# Patient Record
Sex: Male | Born: 1990 | Race: White | Hispanic: No | Marital: Single | Smoking: Current every day smoker
Health system: Southern US, Community
[De-identification: ages and names within clinical notes are randomized; demographics above are authoritative.]

## PROBLEM LIST (undated history)

## (undated) DIAGNOSIS — K119 Disease of salivary gland, unspecified: Secondary | ICD-10-CM

## (undated) DIAGNOSIS — G43909 Migraine, unspecified, not intractable, without status migrainosus: Secondary | ICD-10-CM

## (undated) HISTORY — PX: THROAT SURGERY: SHX803

---
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
## 2012-01-13 ENCOUNTER — Encounter (HOSPITAL_COMMUNITY): Payer: Self-pay | Admitting: *Deleted

## 2012-01-13 ENCOUNTER — Emergency Department (HOSPITAL_COMMUNITY)
Admission: EM | Admit: 2012-01-13 | Discharge: 2012-01-13 | Disposition: A | Discharge status: Home / Self Care | Payer: BC Managed Care – PPO | Attending: Emergency Medicine | Admitting: Emergency Medicine

## 2012-01-13 DIAGNOSIS — R070 Pain in throat: Secondary | ICD-10-CM | POA: Insufficient documentation

## 2012-01-13 DIAGNOSIS — K1120 Sialoadenitis, unspecified: Secondary | ICD-10-CM

## 2012-01-13 DIAGNOSIS — F17200 Nicotine dependence, unspecified, uncomplicated: Secondary | ICD-10-CM | POA: Insufficient documentation

## 2012-01-13 DIAGNOSIS — M542 Cervicalgia: Secondary | ICD-10-CM | POA: Insufficient documentation

## 2012-01-13 HISTORY — DX: Migraine, unspecified, not intractable, without status migrainosus: G43.909

## 2012-01-13 MED ORDER — IBUPROFEN 800 MG PO TABS: 800.0000 mg | ORAL_TABLET | Freq: Three times a day (TID) | ORAL | Status: DC

## 2012-01-13 MED ORDER — CLINDAMYCIN HCL 300 MG PO CAPS: 300.0000 mg | ORAL_CAPSULE | Freq: Three times a day (TID) | ORAL | Status: AC

## 2012-01-13 MED ORDER — HYDROCODONE-ACETAMINOPHEN 5-325 MG PO TABS: 2.0000 | ORAL_TABLET | ORAL | Status: AC | PRN

## 2012-01-13 NOTE — ED Provider Notes (Signed)
History   This chart was scribed for Glynn Octave, MD by Charolett Bumpers . The patient was seen in room APA18/APA18.    CSN: [id number]  Arrival date & time 01/13/12  2140   First MD Initiated Contact with Patient 01/13/12 2153      Chief Complaint  Patient presents with  . Sore Throat    (Consider location/radiation/quality/duration/timing/severity/associated sxs/prior treatment) HPI Walter Benitez is a 21 y.o. male who presents to the Emergency Department complaining of constant, moderate anterior throat pain with associated swelling and difficulty swallowing for the past 3 hours. Patient states that he has 2 salvia glands that are swollen with associated pain. Patient states that he has a h/o similar symptoms. Patient states that his symptoms are aggravated with swallowing. Patient states that he was last here 2 months ago with left sided salviary gland swelling, given antiinflammatory with improvement. Patient denies any fevers or vomiting. Patient denies any prior medical or surgical hx. Nothing makes the symptoms better.    Past Medical History  Diagnosis Date  . Migraine     History reviewed. No pertinent past surgical history.  History reviewed. No pertinent family history.  History  Substance Use Topics  . Smoking status: Current Everyday Smoker    Types: Cigarettes  . Smokeless tobacco: Not on file  . Alcohol Use: No      Review of Systems  Constitutional: Negative for fever.  HENT: Positive for trouble swallowing.        Swollen salvia glands.   Respiratory: Negative for cough and shortness of breath.   Cardiovascular: Negative for chest pain.  Gastrointestinal: Negative for nausea and vomiting.  All other systems reviewed and are negative.    Allergies  Review of patient's allergies indicates no known allergies.  Home Medications   Current Outpatient Rx  Name Route Sig Dispense Refill  . IBUPROFEN 200 MG PO TABS Oral Take 400 mg by  mouth as needed.      BP 152/79  Pulse 67  Temp 98.4 F (36.9 C)  Resp 18  Ht 6\' 1"  (1.854 m)  Wt 175 lb (79.379 kg)  BMI 23.09 kg/m2  SpO2 100%  Physical Exam  Nursing note and vitals reviewed. Constitutional: He is oriented to person, place, and time. He appears well-developed and well-nourished. No distress.  HENT:  Head: Normocephalic and atraumatic.  Mouth/Throat: Posterior oropharyngeal erythema present.       Mild oropharyngeal erythema.   Eyes: EOM are normal. Pupils are equal, round, and reactive to light.  Neck: Normal range of motion. Neck supple. No tracheal deviation present.       Palpable cervical anterior chain adenopathy.   Cardiovascular: Normal rate.   Pulmonary/Chest: Effort normal. No respiratory distress.  Abdominal: Soft. He exhibits no distension.  Musculoskeletal: Normal range of motion. He exhibits no edema.  Lymphadenopathy:    He has cervical adenopathy.  Neurological: He is alert and oriented to person, place, and time. No sensory deficit.  Skin: Skin is warm and dry.  Psychiatric: He has a normal mood and affect. His behavior is normal.    ED Course  Procedures (including critical care time)  DIAGNOSTIC STUDIES: Oxygen Saturation is 100% on room air, normal by my interpretation.    COORDINATION OF CARE:  2209: Discussed planned course of treatment with the patient, who is agreeable at this time.   Results for orders placed during the hospital encounter of 01/13/12  RAPID STREP SCREEN  Component Value Range   Streptococcus, Group A Screen (Direct) NEGATIVE  NEGATIVE     No results found.   No diagnosis found.    MDM  3 hours of anterior throat and neck pain. History of inflamed salivary glands is similar. No drooling, trismus, fevers, vomiting. No cough, chest pain, SOB.  Possible salivary gland stone or infection. No dental problem. No oropharyngeal swelling.  Provide antibiotics, ibuprofen, pain medication, followup with  ENT. Patient instructed to suck on sour candies as needed.  I personally performed the services described in this documentation, which was scribed in my presence.  The recorded information has been reviewed and considered.       Glynn Octave, MD 01/13/12 [phone]

## 2012-01-13 NOTE — Discharge Instructions (Signed)
Salivary Gland Infection Take the antibiotics and pain medicines as prescribed.  Follow up with ENT in [place]. Return to the ED if you develop new or worsening symptoms. A salivary gland infection can be caused by a virus, bacteria from the mouth, or a stone. Mumps and other viruses may settle in one or more of the saliva glands. This will result in swelling, pain, and difficulty eating. Bacteria may cause a more severe infection in a salivary gland. A salivary stone blocking the flow of saliva can make this worse. These infections may be related to other medical problems. Some of these are dehydration, recent surgery, poor nutrition, and some medications. TREATMENT  Treatment of a salivary gland infection depends on the cause. Mumps and other virus infections do not require antibiotics. If bacteria cause the infection, then antibiotics are needed to get rid of the infection. If there is a salivary stone blocking the duct, minor surgery to remove the stone may be needed.  HOME CARE INSTRUCTIONS   Get plenty of rest, increase your fluids, and use warm compresses on the swollen area for 15 to 20 minutes 4 times per day or as often as feels good to you.   Suck on hard candy or chew sugarless gum to promote saliva production.   Only take over-the-counter or prescription medicines for pain, discomfort, or fever as directed by your caregiver.  SEEK IMMEDIATE MEDICAL CARE IF:   You have increased swelling or pain or pain not relieved with medications.   You develop chills or a fever.   Any of your problems are getting worse rather than better.  Document Released: 09/01/2004 Document Revised: 07/14/2011 Document Reviewed: 07/25/2005 [facility] Patient Information 2012 [place], [place].

## 2012-01-13 NOTE — ED Notes (Signed)
Pt alert & oriented x4, stable gait. Pt given discharge instructions, paperwork & prescription(s). Patient instructed to stop at the registration desk to finish any additional paperwork. pt verbalized understanding. Pt left department w/ no further questions.  

## 2012-01-13 NOTE — ED Notes (Signed)
Pt reports in ability to swallow due to pain.  Repots history of inflamed salivary gland and states he presently feels that they are swollen again bilaterally.

## 2012-01-13 NOTE — ED Notes (Signed)
Pt reports problems w/ salvary gland in the past. States pain started about 2 hours ago. States has 2 knots under his jaw.

## 2012-01-22 ENCOUNTER — Encounter (HOSPITAL_COMMUNITY): Payer: Self-pay | Admitting: *Deleted

## 2012-01-22 ENCOUNTER — Emergency Department (HOSPITAL_COMMUNITY)
Admission: EM | Admit: 2012-01-22 | Discharge: 2012-01-22 | Disposition: A | Discharge status: Home / Self Care | Payer: BC Managed Care – PPO | Attending: Emergency Medicine | Admitting: Emergency Medicine

## 2012-01-22 DIAGNOSIS — K115 Sialolithiasis: Secondary | ICD-10-CM

## 2012-01-22 DIAGNOSIS — K1120 Sialoadenitis, unspecified: Secondary | ICD-10-CM

## 2012-01-22 DIAGNOSIS — F17200 Nicotine dependence, unspecified, uncomplicated: Secondary | ICD-10-CM | POA: Insufficient documentation

## 2012-01-22 DIAGNOSIS — R220 Localized swelling, mass and lump, head: Secondary | ICD-10-CM | POA: Insufficient documentation

## 2012-01-22 MED ORDER — NAPROXEN 250 MG PO TABS: 250.0000 mg | ORAL_TABLET | Freq: Two times a day (BID) | ORAL | Status: DC

## 2012-01-22 MED ORDER — AMOXICILLIN-POT CLAVULANATE 875-125 MG PO TABS: 1.0000 | ORAL_TABLET | Freq: Two times a day (BID) | ORAL | Status: DC

## 2012-01-22 MED ORDER — HYDROCODONE-ACETAMINOPHEN 5-325 MG PO TABS: ORAL_TABLET | ORAL | Status: DC

## 2012-01-22 NOTE — ED Notes (Signed)
Patient with no complaints at this time. Respirations even and unlabored. Skin warm/dry. Discharge instructions reviewed with patient at this time. Patient given opportunity to voice concerns/ask questions. Patient discharged at this time and left Emergency Department with steady gait.   

## 2012-01-22 NOTE — ED Notes (Signed)
Pt seen in er with swollen salivary glands recently, states that he has not gotten any better and that it is hard for him to swallow, eat or drink.

## 2012-01-22 NOTE — ED Provider Notes (Signed)
History  This chart was scribed for Laray Anger, DO by Bennett Scrape. This patient was seen in room APA12/APA12 and the patient's care was started at 11:10AM.  CSN: [id number]  Arrival date & time 01/22/12  1047   First MD Initiated Contact with Patient 01/22/12 1110      Chief Complaint  Patient presents with  . Facial Swelling    The history is provided by the patient. No language interpreter was used.    Walter Benitez is a 21 y.o. male who presents to the Emergency Department complaining of gradual onset and persistence of multiple intermittent episodes of "knots in my glands" over the past several years, worse over the past several days.  States the "knots" are located under his chin and are "sore" when palpated.  Pain worsens with swallowing.  Pt has been eval in the ED twice previously for same, has not f/u with ENT as instructed.  Denies throat pain, no intra-oral edema, no fevers, no dental pain/injury, no facial or neck swelling, no SOB/cough, no rash, no N/V.    No Current PCP.  Past Medical History  Diagnosis Date  . Migraine     History reviewed. No pertinent past surgical history.   History  Substance Use Topics  . Smoking status: Current Everyday Smoker    Types: Cigarettes  . Smokeless tobacco: Not on file  . Alcohol Use: No    Review of Systems ROS: Statement: All systems negative except as marked or noted in the HPI; Constitutional: Negative for fever and chills. ; ; Eyes: Negative for eye pain, redness and discharge. ; ; ENMT: +mandibular salivary glands pain.  Negative for ear pain, hoarseness, nasal congestion, sinus pressure and sore throat. ; ; Cardiovascular: Negative for chest pain, palpitations, diaphoresis, dyspnea and peripheral edema. ; ; Respiratory: Negative for cough, wheezing and stridor. ; ; Gastrointestinal: Negative for nausea, vomiting, diarrhea, abdominal pain, blood in stool, hematemesis, jaundice and rectal bleeding. . ; ;  Genitourinary: Negative for dysuria, flank pain and hematuria. ; ; Musculoskeletal: Negative for back pain and neck pain. Negative for swelling and trauma.; ; Skin: Negative for pruritus, rash, abrasions, blisters, bruising and skin lesion.; ; Neuro: Negative for headache, lightheadedness and neck stiffness. Negative for weakness, altered level of consciousness , altered mental status, extremity weakness, paresthesias, involuntary movement, seizure and syncope.     Allergies  Review of patient's allergies indicates no known allergies.  Home Medications   Current Outpatient Rx  Name Route Sig Dispense Refill  . CLINDAMYCIN HCL 300 MG PO CAPS Oral Take 1 capsule (300 mg total) by mouth 3 (three) times daily. 30 capsule 0  . HYDROCODONE-ACETAMINOPHEN 5-325 MG PO TABS Oral Take 2 tablets by mouth every 4 (four) hours as needed for pain. 10 tablet 0  . IBUPROFEN 200 MG PO TABS Oral Take 400 mg by mouth as needed.    . IBUPROFEN 800 MG PO TABS Oral Take 1 tablet (800 mg total) by mouth 3 (three) times daily. 21 tablet 0    Triage Vitals: BP 127/68  Pulse 76  Temp 97.4 F (36.3 C)  Resp 20  Ht 6\' 1"  (1.854 m)  Wt 175 lb (79.379 kg)  BMI 23.09 kg/m2  SpO2 99%  Physical Exam 1115: Physical examination:  Nursing notes reviewed; Vital signs and O2 SAT reviewed;  Constitutional: Well developed, Well nourished, Well hydrated, In no acute distress; Head:  Normocephalic, atraumatic; Eyes: EOMI, PERRL, No scleral icterus; ENMT: Mouth and pharynx  normal, no hoarse voice, no drooling, no stridor, no trismus. Mucous membranes moist; Neck: Supple, Full range of motion, +few palpable small nodules in submandibular area, no submandibular or neck edema or erythema, no parotid edema; Cardiovascular: Regular rate and rhythm, No murmur, rub, or gallop; Respiratory: Breath sounds clear & equal bilaterally, No rales, rhonchi, wheezes.  Speaking full sentences with ease, Normal respiratory effort/excursion; Chest:  Nontender, Movement normal; Abdomen: Soft, Nontender, Nondistended, Normal bowel sounds;; Extremities: Pulses normal, No tenderness, No edema, No calf edema or asymmetry.; Neuro: AA&Ox3, Major CN grossly intact.  Speech clear. No gross focal motor or sensory deficits in extremities.; Skin: Color normal, Warm, Dry.   ED Course  Procedures   MDM  MDM Reviewed: previous chart, nursing note and vitals     11:36 AM:  Pt was eval 2 months ago and 2 weeks ago for same complaint.  States he has had an additional 2 similar episodes in the past 4 years but has never followed up with PMD or ENT due to "losing the referrals."  Did not f/u in ED within 3 days as instructed during his last ED visit 2 weeks ago.  Pt states he filled and has been taking the antibiotic prescribed 2 weeks ago at [facility]; their pharmacy called by ED [facility], they confirm pt did NOT fill the abx rx but filled the narcotic rx.  Does not appear to be Ludwig's or abscess at this time.  Likely salivary stone.  Pt encouraged to take his abx and f/u with ENT this week.         I personally performed the services described in this documentation, which was scribed in my presence. The recorded information has been reviewed and considered. Tyisha Cressy Allison Quarry, DO 01/24/12 1149

## 2012-01-22 NOTE — Discharge Instructions (Signed)
RESOURCE GUIDE  Chronic Pain Problems: Contact [facility]  [phone] Patients need to be referred by their primary care doctor.  Insufficient Money for Medicine: Contact United Way:  call "211" or [facility] [phone].  No Primary Care Doctor: - Call [facility]  [phone] - can help you locate a primary care doctor that  accepts your insurance, provides certain services, etc. - Physician Referral Service- 1-800-533-3463  Agencies that provide inexpensive medical care: - [facility]  [phone] - [facility]  [phone] - [facility]  [phone] - Women's Clinic  [phone] - Planned Parenthood  [phone] - [facility]  [phone]  Medicaid-accepting [place] Providers: - [facility]- [address]  [phone], Mon-Fri 9am-7pm, Sat 9am-1pm - [facility]- [address], [address]  [phone] - [facility]- [address], [address]  [phone] - [facility]- [address] [place] [address]  [phone] - Veita Bland- [address], [phone]  Only accepts [place] Access Medicaid patients after they have their name  applied to their card  Self Pay (no insurance) in [place]: - Sickle Cell Patients: Dr Eric Dean, [facility]  [address], [phone] - [facility]- [address]  [phone]       -     [facility]- [address]       -     [facility]- see information above (Speak to Pam H if you do not have insurance)       -  [facility]- [address], [phone]       -  [facility] [place]- [address],  [phone]       -  [facility]- [address] [place] [address], [phone]       -  Dr Osei-Bonsu-  [address], [place], [phone]       -  [facility]- [address], [phone]       -  [facility]- [address] [place] [address], [phone], also [address], [phone]       -    [facility]- [address], [phone], 1st & 3rd Saturday   every month, 10am-1pm  1) Find a Doctor and Pay Out of Pocket Although you won't have to find out who is covered by your insurance plan, it is a good idea to ask around and get recommendations. You will then need to call the office and see if the doctor you have chosen will accept you as a new patient and what types of options they offer for patients who are self-pay. Some doctors offer discounts or will set up payment plans for their patients who do not have insurance, but you will need to ask so you aren't surprised when you get to your appointment.  2) Contact Your Local Health Department Not all health departments have doctors that can see patients for sick visits, but many do, so it is worth a call to see if yours does. If you don't know where your local health department is, you can check in your phone book. The CDC also has a tool to help you locate your state's health department, and many state websites also have   listings of all of their local health departments.  3) Find a Walk-in Clinic If your illness is not likely to be very severe or complicated, you may want to try a walk in clinic. These are popping up all over the country in pharmacies, drugstores, and shopping centers. They're usually staffed by nurse practitioners or physician assistants that have been trained to treat common illnesses and complaints. They're usually fairly quick and inexpensive. However, if you have serious medical issues or chronic medical problems, these are probably not your best option  STD Testing - [place] [facility] [place], [facility] Clinic, [address], [place], phone [phone] or 1-877-539-9860.  Monday - Friday, call for an appointment. - [place]  [facility] [place], [facility]D Clinic, [address], [place], phone [phone] or 1-877-539-9860.  Monday - Friday, call for an appointment.  Abuse/Neglect: - [place] Child Abuse Hotline (336) 641-3795 - [place] Child Abuse Hotline 800-378-5315 (After Hours)  Emergency Shelter:  [facility] (336) 271-5985  Maternity Homes: - [facility] (336) 275-9566 - [facility] (704) 372-4663  MRSA Hotline #:   832-7006  [place] Resources  [facility] [place]  [employer] [place] [facility] [address]                 [address]         [address]  [place]                                               [place]                              [place] Phone:  [phone]                                  Phone:  [phone]                   Phone:  [phone]  [place] [facility], [phone] - [place] Services - [facility]- 1-888-581-9988       -     [facility] in [place], [address],                                  336-349-4454, Insurance  [place] Child Abuse Hotline (336) 342-1394 or (336) 342-3537 (After Hours)   Behavioral Health Services  Substance Abuse Resources: - [facility]  336-882-2125 - [facility] 336-784-9470 - [facility] 336-285-9073 - [facility] 336-845-3988 - Residential & Outpatient Substance Abuse Program  800-659-3381  Psychological Services: -  Health  [phone] - [facility]  [phone] - [place] [facility], [address], [place], ACCESS LINE: 1-800-853-5163 or 336-641-4981, Http://www.[facility].com/services/adult.htm  Dental Assistance  If unable to pay or uninsured, contact:  [facility] or [place] [facility] to become qualified for the adult dental  clinic.  Patients with Medicaid:  [facility] [address], [phone], [phone]  If unable   to pay, or uninsured, contact [facility] [phone]) or [facility] [phone] in [place], [phone] in [facility]) to become qualified for the adult dental clinic  Other Low-Cost Community Dental Services: - [facility]- [address] [place], [place], [place], [address], [phone], [address], 2nd and 4th Thursday of the month at 6:30am.  10 clients each day by appointment, can sometimes see walk-in patients if someone does not show for an appointment. [facility]- [address] [place], [place], [address], [phone] - [facility]- [address] [place], [place], [address], [phone] [facility]- [phone] [facility] Department- [phone] [facility] [phone]     Take the prescriptions as directed.  Apply moist heat to the area(s) of discomfort, for 15 minutes at a time, several times per day for the next few days.  Do not fall asleep on a heating pack.  Call your regular medical doctor and the ENT doctor on Monday to schedule a follow up appointment this week.  Return to the Emergency Department immediately if worsening.

## 2012-02-01 ENCOUNTER — Emergency Department (HOSPITAL_COMMUNITY)
Admission: EM | Admit: 2012-02-01 | Discharge: 2012-02-01 | Disposition: A | Discharge status: Home / Self Care | Payer: BC Managed Care – PPO | Attending: Emergency Medicine | Admitting: Emergency Medicine

## 2012-02-01 ENCOUNTER — Encounter (HOSPITAL_COMMUNITY): Payer: Self-pay | Admitting: *Deleted

## 2012-02-01 DIAGNOSIS — K1120 Sialoadenitis, unspecified: Secondary | ICD-10-CM

## 2012-02-01 DIAGNOSIS — R599 Enlarged lymph nodes, unspecified: Secondary | ICD-10-CM | POA: Insufficient documentation

## 2012-02-01 DIAGNOSIS — F17200 Nicotine dependence, unspecified, uncomplicated: Secondary | ICD-10-CM | POA: Insufficient documentation

## 2012-02-01 MED ORDER — HYDROCODONE-ACETAMINOPHEN 5-325 MG PO TABS: ORAL_TABLET | ORAL | Status: AC

## 2012-02-01 MED ORDER — IBUPROFEN 800 MG PO TABS: 800.0000 mg | ORAL_TABLET | Freq: Three times a day (TID) | ORAL | Status: AC

## 2012-02-01 NOTE — ED Notes (Signed)
Pt was seen here for "swollen glands" in neck. Given antibiotic,  And has finished that, Now says it is worse. And cannot afford to go to ENT that he was referred to.  NAD

## 2012-02-01 NOTE — ED Provider Notes (Signed)
History     CSN: [id number]  Arrival date & time 02/01/12  1138   First MD Initiated Contact with Patient 02/01/12 1152      Chief Complaint  Patient presents with  . Lymphadenopathy    (Consider location/radiation/quality/duration/timing/severity/associated sxs/prior treatment) HPI Comments: Patient c/o recurrent sore throat pain for several weeks.  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  He has recently finished Augmentin and pain medications and states the symptoms had improved but began to have pain again three days ago similar to his previous episodes.  He states that he did not follow-up with ENT as recommended because he does not have the finances for the office visit.  He denies swelling of his throat or tongue, fever, vomiting or facial pain  Patient is a 21 y.o. male presenting with pharyngitis. The history is provided by the patient.  Sore Throat This is a recurrent problem. The current episode started 1 to 4 weeks ago. The problem occurs intermittently. The problem has been waxing and waning. Associated symptoms include a sore throat and swollen glands. Pertinent negatives include no abdominal pain, chills, congestion, coughing, fever, headaches, nausea, neck pain, rash, vomiting or weakness. The symptoms are aggravated by swallowing. He has tried NSAIDs for the symptoms. The treatment provided no relief.    Past Medical History  Diagnosis Date  . Migraine     History reviewed. No pertinent past surgical history.  History reviewed. No pertinent family history.  History  Substance Use Topics  . Smoking status: Current Everyday Smoker    Types: Cigarettes  . Smokeless tobacco: Not on file  . Alcohol Use: No      Review of Systems  Constitutional: Negative for fever, chills, activity change and appetite change.  HENT: Positive for sore throat. Negative for ear pain, congestion, facial swelling, trouble  swallowing and neck pain.   Respiratory: Negative for cough and stridor.   Gastrointestinal: Negative for nausea, vomiting and abdominal pain.  Skin: Negative for rash.  Neurological: Negative for weakness and headaches.  All other systems reviewed and are negative.    Allergies  Review of patient's allergies indicates no known allergies.  Home Medications   Current Outpatient Rx  Name Route Sig Dispense Refill  . IBUPROFEN 200 MG PO TABS Oral Take 400 mg by mouth as needed.    . AMOXICILLIN-POT CLAVULANATE 875-125 MG PO TABS Oral Take 1 tablet by mouth every 12 (twelve) hours.    Marland Kitchen HYDROCODONE-ACETAMINOPHEN 5-325 MG PO TABS  1 or 2 tabs PO q4-6 hours prn pain    . NAPROXEN 250 MG PO TABS Oral Take 250 mg by mouth 2 (two) times daily with a meal.      BP 141/83  Pulse 80  Temp 98.2 F (36.8 C) (Oral)  Resp 20  Ht 6' (1.829 m)  Wt 175 lb (79.379 kg)  BMI 23.73 kg/m2  SpO2 100%  Physical Exam  Nursing note and vitals reviewed. Constitutional: He is oriented to person, place, and time. He appears well-developed and well-nourished. No distress.  HENT:  Head: Normocephalic and atraumatic. No trismus in the jaw.  Right Ear: Tympanic membrane and ear canal normal.  Left Ear: Tympanic membrane and ear canal normal.  Mouth/Throat: Uvula is midline, oropharynx is clear and moist and mucous membranes are normal. No uvula swelling. No tonsillar abscesses.  Neck: Normal range of motion and phonation normal. Neck supple. No tracheal tenderness present.  Cardiovascular: Normal  rate, regular rhythm, normal heart sounds and intact distal pulses.   No murmur heard. Pulmonary/Chest: Effort normal and breath sounds normal. No stridor.  Musculoskeletal: Normal range of motion.  Lymphadenopathy:       Head (right side): No submental, no submandibular, no tonsillar and no preauricular adenopathy present.       Head (left side): No submental, no submandibular, no tonsillar and no preauricular  adenopathy present.    He has no cervical adenopathy.  Neurological: He is alert and oriented to person, place, and time.  Skin: Skin is warm and dry.    ED Course  Procedures (including critical care time)  Labs Reviewed - No data to display      MDM      Previous ER charts, nursing notes and vital signs were reviewed by me.  Patient has been treated here previously for same and recently finished  antibiotic therapy.  Has not followed up with ENT as advised.  Airway is patent. Afebrile. Vital signs are stable. He handles his secretions well.  Phonation is nml.  He is non-toxic appearing.  No palpable lymphadenopathy of the neck or head.    I have advised pt that he will need further management of sx's by either his PMD or ENT.  He verbalized understanding.    The patient appears reasonably screened and/or stabilized for discharge and I doubt any other medical condition or other [facility] requiring further screening, evaluation, or treatment in the ED at this time prior to discharge.   I have advised him that he will need further management with ENT.    Prescribed: Norco #15 ibuprofen  Alain Deschene L. Travell Desaulniers, [place] 02/03/12 1243

## 2012-02-04 NOTE — ED Provider Notes (Signed)
Medical screening examination/treatment/procedure(s) were performed by non-physician practitioner and as supervising physician I was immediately available for consultation/collaboration.   Joya Gaskins, MD 02/04/12 [phone]

## 2012-02-24 ENCOUNTER — Encounter (HOSPITAL_COMMUNITY): Payer: Self-pay

## 2012-02-24 ENCOUNTER — Emergency Department (HOSPITAL_COMMUNITY)
Admission: EM | Admit: 2012-02-24 | Discharge: 2012-02-24 | Disposition: A | Discharge status: Home / Self Care | Payer: BC Managed Care – PPO | Attending: Emergency Medicine | Admitting: Emergency Medicine

## 2012-02-24 DIAGNOSIS — R070 Pain in throat: Secondary | ICD-10-CM | POA: Insufficient documentation

## 2012-02-24 DIAGNOSIS — K1120 Sialoadenitis, unspecified: Secondary | ICD-10-CM

## 2012-02-24 DIAGNOSIS — F17200 Nicotine dependence, unspecified, uncomplicated: Secondary | ICD-10-CM | POA: Insufficient documentation

## 2012-02-24 MED ORDER — HYDROCODONE-ACETAMINOPHEN 5-325 MG PO TABS: 1.0000 | ORAL_TABLET | ORAL | Status: DC | PRN

## 2012-02-24 MED ORDER — HYDROCODONE-ACETAMINOPHEN 5-325 MG PO TABS: 1.0000 | ORAL_TABLET | ORAL | Status: AC | PRN

## 2012-02-24 MED ORDER — IBUPROFEN 600 MG PO TABS: 600.0000 mg | ORAL_TABLET | Freq: Four times a day (QID) | ORAL | Status: AC | PRN

## 2012-02-24 NOTE — ED Provider Notes (Signed)
History     CSN: [id number]  Arrival date & time 02/24/12  1019   First MD Initiated Contact with Patient 02/24/12 1028      Chief Complaint  Patient presents with  . Sore Throat    (Consider location/radiation/quality/duration/timing/severity/associated sxs/prior treatment) HPI Comments: Walter Benitez presents with pain and swelling in his submandibular area which has been intermittent symptoms for the past several months.  3 weeks ago he had an office procedure by Dr. Suszanne Conners remove several salivary stones but he continues to have pain and intermittent swelling.  He has not followed up with Dr. Suszanne Conners since his procedure but he has completed his pain medication and antibiotics.  He's been using an ice pack since yesterday evening and also took ibuprofen 800 mg last night which  did not significantly improve his symptoms.  He denies fevers or chills but does have increased pain with swallowing.  He denies shortness of breath or throat swelling.  He has not contacted Dr. Suszanne Conners yet today regarding his worst symptoms but plans to call him this afternoon.  The history is provided by the patient.    Past Medical History  Diagnosis Date  . Migraine     Past Surgical History  Procedure Date  . Throat surgery     No family history on file.  History  Substance Use Topics  . Smoking status: Current Everyday Smoker    Types: Cigarettes  . Smokeless tobacco: Not on file  . Alcohol Use: No      Review of Systems  Constitutional: Negative for fever.  HENT: Positive for sore throat and neck pain. Negative for congestion, trouble swallowing and voice change.   Eyes: Negative.   Respiratory: Negative for chest tightness and shortness of breath.   Cardiovascular: Negative for chest pain.  Gastrointestinal: Negative for nausea and abdominal pain.  Genitourinary: Negative.   Musculoskeletal: Negative for joint swelling and arthralgias.  Skin: Negative.  Negative for rash and wound.    Neurological: Negative for dizziness, weakness, light-headedness, numbness and headaches.  Hematological: Negative.   Psychiatric/Behavioral: Negative.     Allergies  Review of patient's allergies indicates no known allergies.  Home Medications   Current Outpatient Rx  Name Route Sig Dispense Refill  . HYDROCODONE-ACETAMINOPHEN 5-325 MG PO TABS Oral Take 1 tablet by mouth every 4 (four) hours as needed for pain. 20 tablet 0  . IBUPROFEN 200 MG PO TABS Oral Take 400 mg by mouth as needed.    . IBUPROFEN 600 MG PO TABS Oral Take 1 tablet (600 mg total) by mouth every 6 (six) hours as needed for pain. 30 tablet 0  . NAPROXEN 250 MG PO TABS Oral Take 250 mg by mouth 2 (two) times daily with a meal.      BP 135/88  Pulse 55  Temp 97.6 F (36.4 C) (Oral)  Resp 16  Ht 6\' 1"  (1.854 m)  Wt 180 lb (81.647 kg)  BMI 23.75 kg/m2  SpO2 100%  Physical Exam  Nursing note and vitals reviewed. Constitutional: He appears well-developed and well-nourished.  HENT:  Head: Normocephalic and atraumatic.  Right Ear: External ear normal.  Left Ear: External ear normal.  Nose: Nose normal.  Mouth/Throat: Uvula is midline, oropharynx is clear and moist and mucous membranes are normal.       Left submandibular lymph node tender and moderately indurated.  There is no expressible purulence in subungual space.  Floor of mouth is relatively soft except for the well  circumscribed  Nodule.  No erythema.  Eyes: Conjunctivae are normal.  Neck: Normal range of motion.  Cardiovascular: Normal rate, regular rhythm, normal heart sounds and intact distal pulses.   Pulmonary/Chest: Effort normal and breath sounds normal. He has no wheezes.  Abdominal: Soft. Bowel sounds are normal. There is no tenderness.  Musculoskeletal: Normal range of motion.  Neurological: He is alert.  Skin: Skin is warm and dry.  Psychiatric: He has a normal mood and affect.    ED Course  Procedures (including critical care  time)  Labs Reviewed - No data to display No results found.   No diagnosis found.    MDM  Ibuprofen 800 mg, hydrocodone 5/325 prescribed.  Ice packs were recommended.  Patient also encouraged to contact Dr. Suszanne Conners if he develops further problems or his symptoms are not improving with this treatment.  No signs of any salivary gland infection on today's exam.        Burgess Amor, PA 02/24/12 1213  Burgess Amor, [place] 02/24/12 1214

## 2012-02-24 NOTE — ED Notes (Signed)
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 States he had surgery 3 weeks ago to relieve the pressure in his glands, today the discomfort has returned. Swelling noted in glands bilaterally.

## 2012-02-24 NOTE — ED Notes (Signed)
Complain of pain in throat pain. Has surgery three weeks ago for same

## 2012-02-28 NOTE — ED Provider Notes (Signed)
Medical screening examination/treatment/procedure(s) were performed by non-physician practitioner and as supervising physician I was immediately available for consultation/collaboration.   Estefani Bateson W. Silvie Obremski, MD 02/28/12 2157 

## 2012-03-01 ENCOUNTER — Other Ambulatory Visit: Payer: Self-pay | Admitting: Otolaryngology

## 2012-03-01 DIAGNOSIS — K1120 Sialoadenitis, unspecified: Secondary | ICD-10-CM

## 2012-03-02 ENCOUNTER — Ambulatory Visit
Admission: RE | Admit: 2012-03-02 | Discharge: 2012-03-02 | Disposition: A | Discharge status: Home / Self Care | Payer: BC Managed Care – HMO | Source: Ambulatory Visit | Attending: Otolaryngology | Admitting: Otolaryngology

## 2012-03-02 DIAGNOSIS — K1120 Sialoadenitis, unspecified: Secondary | ICD-10-CM

## 2012-03-04 ENCOUNTER — Encounter (HOSPITAL_COMMUNITY): Payer: Self-pay | Admitting: *Deleted

## 2012-03-04 ENCOUNTER — Emergency Department (HOSPITAL_COMMUNITY)
Admission: EM | Admit: 2012-03-04 | Discharge: 2012-03-04 | Disposition: A | Discharge status: Home / Self Care | Payer: BC Managed Care – PPO | Attending: Emergency Medicine | Admitting: Emergency Medicine

## 2012-03-04 DIAGNOSIS — J029 Acute pharyngitis, unspecified: Secondary | ICD-10-CM

## 2012-03-04 DIAGNOSIS — F17200 Nicotine dependence, unspecified, uncomplicated: Secondary | ICD-10-CM | POA: Insufficient documentation

## 2012-03-04 DIAGNOSIS — R070 Pain in throat: Secondary | ICD-10-CM | POA: Insufficient documentation

## 2012-03-04 HISTORY — DX: Disease of salivary gland, unspecified: K11.9

## 2012-03-04 MED ORDER — HYDROCODONE-ACETAMINOPHEN 5-325 MG PO TABS: ORAL_TABLET | ORAL | Status: AC

## 2012-03-04 NOTE — ED Notes (Signed)
Pt c/o continued pain in salivary glands, states that he was seen by ENT in [place] and placed on steroids and antibiotics 7 days ago, pt states that he has not been able to sleep due to pain,  Pt states that his left side is better but his right side is not,

## 2012-03-04 NOTE — ED Provider Notes (Signed)
History     CSN: [id number]  Arrival date & time 03/04/12  1342   First MD Initiated Contact with Patient 03/04/12 1439      Chief Complaint  Patient presents with  . Sore Throat    (Consider location/radiation/quality/duration/timing/severity/associated sxs/prior treatment) HPI Comments: Patient c/o chronic sore throat and neck pain for weeks.  Has been seen here multiple times and treated for same.  He also states that he has seen Dr. Lazarus Salines, ENT, for this and was recently told that a recent MRI showed an abnormality but he is unsure of the diagnosis.  He also states that he was put on antibiotics and steroids and the sx's improved for a short time but are back again. He is requesting another rx for steroids.   Pain is worse with swallowing and movement of his neck.  He denies swelling, fever neck stiffness or facial swelling  Patient is a 21 y.o. male presenting with pharyngitis. The history is provided by the patient.  Sore Throat This is a chronic problem. The current episode started in the past 7 days. The problem occurs constantly. The problem has been unchanged. Associated symptoms include neck pain and a sore throat. Pertinent negatives include no chills, congestion, coughing, fever, headaches, numbness, rash, swollen glands, vomiting or weakness. The symptoms are aggravated by swallowing (movemetn of his neck). He has tried nothing for the symptoms. The treatment provided mild relief.    Past Medical History  Diagnosis Date  . Migraine   . Unspecified disease of the salivary glands     Past Surgical History  Procedure Date  . Throat surgery     No family history on file.  History  Substance Use Topics  . Smoking status: Current Everyday Smoker    Types: Cigarettes  . Smokeless tobacco: Not on file  . Alcohol Use: No      Review of Systems  Constitutional: Negative for fever, chills and appetite change.  HENT: Positive for sore throat and neck pain. Negative  for ear pain, congestion, facial swelling, mouth sores, trouble swallowing, neck stiffness and dental problem.   Eyes: Negative for pain and visual disturbance.  Respiratory: Negative for cough.   Gastrointestinal: Negative for vomiting.  Skin: Negative for rash.  Neurological: Negative for dizziness, facial asymmetry, weakness, numbness and headaches.  Hematological: Negative for adenopathy.  All other systems reviewed and are negative.    Allergies  Review of patient's allergies indicates no known allergies.  Home Medications   Current Outpatient Rx  Name Route Sig Dispense Refill  . HYDROCODONE-ACETAMINOPHEN 5-325 MG PO TABS Oral Take 1 tablet by mouth every 4 (four) hours as needed for pain. 20 tablet 0  . IBUPROFEN 200 MG PO TABS Oral Take 400 mg by mouth as needed.    . IBUPROFEN 600 MG PO TABS Oral Take 1 tablet (600 mg total) by mouth every 6 (six) hours as needed for pain. 30 tablet 0  . NAPROXEN 250 MG PO TABS Oral Take 250 mg by mouth 2 (two) times daily with a meal.      BP 132/85  Pulse 78  Temp 98.2 F (36.8 C)  Resp 20  SpO2 99%  Physical Exam  Nursing note and vitals reviewed. Constitutional: He is oriented to person, place, and time. He appears well-developed and well-nourished. No distress.  HENT:  Head: Normocephalic and atraumatic. No trismus in the jaw.  Right Ear: Tympanic membrane and ear canal normal.  Left Ear: Tympanic membrane and ear  canal normal.  Nose: Right sinus exhibits no maxillary sinus tenderness and no frontal sinus tenderness. Left sinus exhibits no maxillary sinus tenderness and no frontal sinus tenderness.  Mouth/Throat: Uvula is midline and mucous membranes are normal. No dental abscesses or uvula swelling. No oropharyngeal exudate, posterior oropharyngeal edema, posterior oropharyngeal erythema or tonsillar abscesses.       Airway patent w/o obvious abml of the oropharynx  Neck: Normal range of motion. Neck supple.  Cardiovascular:  Normal rate, regular rhythm and normal heart sounds.   Pulmonary/Chest: Effort normal and breath sounds normal.  Musculoskeletal: Normal range of motion. He exhibits no edema.  Lymphadenopathy:    He has no cervical adenopathy.  Neurological: He is alert and oriented to person, place, and time. He exhibits normal muscle tone. Coordination normal.  Skin: Skin is warm and dry.    ED Course  Procedures (including critical care time)  Labs Reviewed - No data to display      MDM    Patient is alert. Vital signs are stable. Airway remains patent. No obvious deformities of the neck. Oropharynx is normal in color tonsils are not enlarged, no exudates are present. No cervical lymphadenopathy. Patient has been seen here multiple times for similar symptoms he is been treated by his primary care physician and by ENT. He has just finished a course of antibiotics and prednisone.  Patient is requesting additional prednisone. I've explained to him the side effects of persistent prednisone use and that further management of his situation needs to be handled by his primary care physician or ENT. Patient verbalized understanding and agrees to care plan.  The patient appears reasonably screened and/or stabilized for discharge and I doubt any other medical condition or other [facility] requiring further screening, evaluation, or treatment in the ED at this time prior to discharge.    Prescribed: norco #15     Keisy Strickler L. [place], [place] 03/09/12 1647

## 2012-03-14 NOTE — ED Provider Notes (Signed)
Medical screening examination/treatment/procedure(s) were conducted as a shared visit with non-physician practitioner(s) and myself.  I personally evaluated the patient during the encounter.  Patient understands to get further ENT followup with Dr. Lazarus Salines.  No meningeal signs.  Donnetta Hutching, MD 03/14/12 [phone]

## 2012-04-27 ENCOUNTER — Encounter (HOSPITAL_COMMUNITY): Payer: Self-pay | Admitting: *Deleted

## 2012-04-27 ENCOUNTER — Emergency Department (HOSPITAL_COMMUNITY)
Admission: EM | Admit: 2012-04-27 | Discharge: 2012-04-27 | Disposition: A | Discharge status: Home / Self Care | Payer: BC Managed Care – PPO | Attending: Emergency Medicine | Admitting: Emergency Medicine

## 2012-04-27 DIAGNOSIS — F17200 Nicotine dependence, unspecified, uncomplicated: Secondary | ICD-10-CM | POA: Insufficient documentation

## 2012-04-27 DIAGNOSIS — K1120 Sialoadenitis, unspecified: Secondary | ICD-10-CM

## 2012-04-27 MED ORDER — PREDNISONE 20 MG PO TABS
60.0000 mg | ORAL_TABLET | Freq: Once | ORAL | Status: AC
  Administered 2012-04-27: 60 mg via ORAL
  Filled 2012-04-27: qty 3

## 2012-04-27 MED ORDER — PREDNISONE 10 MG PO TABS: ORAL_TABLET | ORAL | Status: DC

## 2012-04-27 MED ORDER — HYDROCODONE-ACETAMINOPHEN 5-325 MG PO TABS: 1.0000 | ORAL_TABLET | ORAL | Status: AC | PRN

## 2012-04-27 NOTE — ED Notes (Signed)
Patient with no complaints at this time. Respirations even and unlabored. Skin warm/dry. Discharge instructions reviewed with patient at this time. Patient given opportunity to voice concerns/ask questions. Patient discharged at this time and left Emergency Department with steady gait.   

## 2012-04-27 NOTE — ED Notes (Signed)
Slight right parotid swelling visualized, tender to palpation.  States pain has kept him awake.  He has appointment in 2 weeks w/Dr. Lazarus Salines, who will decide if he needs referral to specialist in [facility].

## 2012-04-27 NOTE — ED Notes (Signed)
Throat sore , problem with salivary glands and tx by ENT in [place].  Has appt in 2 weeks.

## 2012-04-28 NOTE — ED Provider Notes (Signed)
History     CSN: [id number]  Arrival date & time 04/27/12  1130   First MD Initiated Contact with Patient 04/27/12 1209      Chief Complaint  Patient presents with  . Sore Throat    (Consider location/radiation/quality/duration/timing/severity/associated sxs/prior treatment) HPI Comments: Walter Benitez presents with a recurrence of swelling and pain in his submandibular space, right greater than left which he states is similar to previous episodes of salivary gland stones for which he see Dr .Lazarus Salines.  This flare up started yesterday evening and has progressed today.  He denies fevers and chills and has had no purulent drainage from under his tongue.  He called Dr. Raye Sorrow office,  But cannot be seen for 2 weeks.  Reports he has had a surgical removal of stones in the past and has been told he may need to be referred to a specialist at [facility] due frequent recurrences of this problem.  He has taken ibuprofen and used warm compresses without relief of symptoms.  Swelling worsened this morning after eating breakfast, has reduced slightly since that time.  The history is provided by the patient.    Past Medical History  Diagnosis Date  . Migraine   . Unspecified disease of the salivary glands     Past Surgical History  Procedure Date  . Throat surgery     History reviewed. No pertinent family history.  History  Substance Use Topics  . Smoking status: Current Every Day Smoker    Types: Cigarettes  . Smokeless tobacco: Not on file  . Alcohol Use: No      Review of Systems  Constitutional: Negative for fever and chills.  HENT: Positive for facial swelling. Negative for ear pain, congestion, sore throat, rhinorrhea, drooling, neck pain, neck stiffness and voice change.   Eyes: Negative.   Respiratory: Negative for chest tightness, shortness of breath, wheezing and stridor.   Cardiovascular: Negative for chest pain.  Gastrointestinal: Negative for nausea and  abdominal pain.  Genitourinary: Negative.   Musculoskeletal: Negative for joint swelling and arthralgias.  Skin: Negative.  Negative for rash and wound.  Neurological: Negative for dizziness, weakness, light-headedness, numbness and headaches.  Hematological: Negative.   Psychiatric/Behavioral: Negative.     Allergies  Review of patient's allergies indicates no known allergies.  Home Medications   Current Outpatient Rx  Name Route Sig Dispense Refill  . IBUPROFEN 200 MG PO TABS Oral Take 400 mg by mouth as needed.    Marland Kitchen HYDROCODONE-ACETAMINOPHEN 5-325 MG PO TABS Oral Take 1 tablet by mouth every 4 (four) hours as needed for pain. 15 tablet 0  . PREDNISONE 10 MG PO TABS  6, 5, 4, 3, 2 then 1 tablet by mouth daily for 6 days total. 21 tablet 0    BP 129/74  Pulse 69  Temp 98.5 F (36.9 C) (Oral)  Resp 20  Ht 6\' 1"  (1.854 m)  Wt 180 lb (81.647 kg)  BMI 23.75 kg/m2  SpO2 100%  Physical Exam  Nursing note and vitals reviewed. Constitutional: He appears well-developed and well-nourished.  HENT:  Head: Normocephalic and atraumatic. No trismus in the jaw.  Right Ear: Tympanic membrane and ear canal normal.  Left Ear: Tympanic membrane and ear canal normal.  Nose: Nose normal.  Mouth/Throat: Mucous membranes are normal. No dental abscesses or uvula swelling. No oropharyngeal exudate, posterior oropharyngeal edema, posterior oropharyngeal erythema or tonsillar abscesses.       TTP with mild swelling in sublingual space,  Swelling is soft,  Not indurated, there is no erythema,  No purulent discharge from sublingual salivary ducts.  With no palpable foreign body.    Eyes: Conjunctivae normal are normal.  Neck: Normal range of motion.  Cardiovascular: Normal rate, regular rhythm, normal heart sounds and intact distal pulses.   Pulmonary/Chest: Effort normal and breath sounds normal. He has no wheezes.  Abdominal: Soft. Bowel sounds are normal. There is no tenderness.  Musculoskeletal:  Normal range of motion.  Lymphadenopathy:    He has no cervical adenopathy.  Neurological: He is alert.  Skin: Skin is warm and dry.  Psychiatric: He has a normal mood and affect.    ED Course  Procedures (including critical care time)  Labs Reviewed - No data to display No results found.   1. Sialadenitis       MDM  Suspect sublingual gland inflammation vs possible stone,  Although not palpable.  Pt recommended warm compresses,  Sialogogues, prescribed prednisone taper.  No indication for abx at this time.  Advised close f/u with Dr Lazarus Salines.        Burgess Amor, [place] 04/28/12 [phone]

## 2012-04-30 NOTE — ED Provider Notes (Signed)
Medical screening examination/treatment/procedure(s) were performed by non-physician practitioner and as supervising physician I was immediately available for consultation/collaboration.   Dawnisha Marquina L Dolce Sylvia, MD 04/30/12 0941 

## 2012-05-09 ENCOUNTER — Encounter (HOSPITAL_COMMUNITY): Payer: Self-pay | Admitting: Emergency Medicine

## 2012-05-09 ENCOUNTER — Emergency Department (HOSPITAL_COMMUNITY)
Admission: EM | Admit: 2012-05-09 | Discharge: 2012-05-09 | Disposition: A | Discharge status: Home / Self Care | Payer: BC Managed Care – PPO | Attending: Emergency Medicine | Admitting: Emergency Medicine

## 2012-05-09 DIAGNOSIS — F17200 Nicotine dependence, unspecified, uncomplicated: Secondary | ICD-10-CM | POA: Insufficient documentation

## 2012-05-09 DIAGNOSIS — K029 Dental caries, unspecified: Secondary | ICD-10-CM | POA: Insufficient documentation

## 2012-05-09 DIAGNOSIS — K0889 Other specified disorders of teeth and supporting structures: Secondary | ICD-10-CM

## 2012-05-09 MED ORDER — IBUPROFEN 400 MG PO TABS
600.0000 mg | ORAL_TABLET | Freq: Once | ORAL | Status: AC
  Administered 2012-05-09: 600 mg via ORAL
  Filled 2012-05-09: qty 2

## 2012-05-09 MED ORDER — IBUPROFEN 600 MG PO TABS: 600.0000 mg | ORAL_TABLET | Freq: Three times a day (TID) | ORAL | Status: DC | PRN

## 2012-05-09 MED ORDER — PENICILLIN V POTASSIUM 500 MG PO TABS: 500.0000 mg | ORAL_TABLET | Freq: Four times a day (QID) | ORAL | Status: AC

## 2012-05-09 MED ORDER — OXYCODONE-ACETAMINOPHEN 5-325 MG PO TABS
1.0000 | ORAL_TABLET | Freq: Once | ORAL | Status: AC
  Administered 2012-05-09: 1 via ORAL
  Filled 2012-05-09: qty 1

## 2012-05-09 MED ORDER — OXYCODONE-ACETAMINOPHEN 5-325 MG PO TABS: 1.0000 | ORAL_TABLET | ORAL | Status: DC | PRN

## 2012-05-09 NOTE — ED Provider Notes (Signed)
History     CSN: [id number]  Arrival date & time 05/09/12  0225   First MD Initiated Contact with Patient 05/09/12 0243      Chief Complaint  Patient presents with  . Dental Pain     The history is provided by the patient.  patient reports worsening dental pain for several months.  He reports severe pain in his left lower third molar for approximately 5-6 days.  He saw his dentist who has recommended that he be seen by the oral surgeon for further evaluation.  The patient reports that he is trying to make any appointment with the oral surgeon but has been unable to as of yet.  He reports his pain is moderate to severe at this time.  He reports is affecting his sleep.  He reports cold fluids worsen his pain has disappeared and chewing.  His pain is improved by nothing including over-the-counter medications.  His no fevers or chills.  Has no difficulty breathing.  He denies facial swelling.  Past Medical History  Diagnosis Date  . Migraine   . Unspecified disease of the salivary glands     Past Surgical History  Procedure Date  . Throat surgery     History reviewed. No pertinent family history.  History  Substance Use Topics  . Smoking status: Current Every Day Smoker    Types: Cigarettes  . Smokeless tobacco: Not on file  . Alcohol Use: No      Review of Systems  All other systems reviewed and are negative.    Allergies  Review of patient's allergies indicates no known allergies.  Home Medications   Current Outpatient Rx  Name Route Sig Dispense Refill  . IBUPROFEN 200 MG PO TABS Oral Take 400 mg by mouth as needed.    . IBUPROFEN 600 MG PO TABS Oral Take 1 tablet (600 mg total) by mouth every 8 (eight) hours as needed for pain. 15 tablet 0  . OXYCODONE-ACETAMINOPHEN 5-325 MG PO TABS Oral Take 1 tablet by mouth every 4 (four) hours as needed for pain. 25 tablet 0  . PENICILLIN V POTASSIUM 500 MG PO TABS Oral Take 1 tablet (500 mg total) by mouth 4 (four) times  daily. 40 tablet 0  . PREDNISONE 10 MG PO TABS  6, 5, 4, 3, 2 then 1 tablet by mouth daily for 6 days total. 21 tablet 0    BP 128/92  Pulse 105  Temp 97.9 F (36.6 C) (Oral)  Resp 16  Ht 6\' 1"  (1.854 m)  Wt 180 lb (81.647 kg)  BMI 23.75 kg/m2  SpO2 98%  Physical Exam  Constitutional: He is oriented to person, place, and time. He appears well-developed and well-nourished.  HENT:  Head: Normocephalic.       Tenderness and decay at his left lower third molar. No change once her swelling.  Eyes: EOM are normal.  Neck: Normal range of motion.  Pulmonary/Chest: Effort normal.  Abdominal: He exhibits no distension.  Musculoskeletal: Normal range of motion.  Neurological: He is alert and oriented to person, place, and time.  Psychiatric: He has a normal mood and affect.    ED Course  Procedures (including critical care time)  Labs Reviewed - No data to display No results found.   1. Pain, dental       MDM  Dental Pain. Home with antibiotics and pain medicine. Recommend dental/oral surgery follow up. No signs of gingival abscess. Tolerating secretions. Airway patent. No sub lingular  swelling. Already planning to see oral surgeon Dr Jeanice Lim as referred by his dentist         Lyanne Co, MD 05/09/12 [id number]

## 2012-05-09 NOTE — ED Notes (Signed)
Patient complaining of lower right dental pain x 4 months, worsening 6 days ago.

## 2012-05-30 ENCOUNTER — Encounter (HOSPITAL_COMMUNITY): Payer: Self-pay | Admitting: *Deleted

## 2012-05-30 ENCOUNTER — Emergency Department (HOSPITAL_COMMUNITY)
Admission: EM | Admit: 2012-05-30 | Discharge: 2012-05-30 | Disposition: A | Discharge status: Home / Self Care | Payer: BC Managed Care – PPO | Attending: Emergency Medicine | Admitting: Emergency Medicine

## 2012-05-30 DIAGNOSIS — K029 Dental caries, unspecified: Secondary | ICD-10-CM | POA: Insufficient documentation

## 2012-05-30 DIAGNOSIS — K119 Disease of salivary gland, unspecified: Secondary | ICD-10-CM | POA: Insufficient documentation

## 2012-05-30 DIAGNOSIS — F17200 Nicotine dependence, unspecified, uncomplicated: Secondary | ICD-10-CM | POA: Insufficient documentation

## 2012-05-30 DIAGNOSIS — Z791 Long term (current) use of non-steroidal anti-inflammatories (NSAID): Secondary | ICD-10-CM | POA: Insufficient documentation

## 2012-05-30 DIAGNOSIS — Z79899 Other long term (current) drug therapy: Secondary | ICD-10-CM | POA: Insufficient documentation

## 2012-05-30 DIAGNOSIS — K0889 Other specified disorders of teeth and supporting structures: Secondary | ICD-10-CM

## 2012-05-30 DIAGNOSIS — K089 Disorder of teeth and supporting structures, unspecified: Secondary | ICD-10-CM | POA: Insufficient documentation

## 2012-05-30 DIAGNOSIS — G43909 Migraine, unspecified, not intractable, without status migrainosus: Secondary | ICD-10-CM | POA: Insufficient documentation

## 2012-05-30 MED ORDER — HYDROCODONE-ACETAMINOPHEN 5-325 MG PO TABS: ORAL_TABLET | ORAL | Status: DC

## 2012-05-30 MED ORDER — NAPROXEN 250 MG PO TABS: 250.0000 mg | ORAL_TABLET | Freq: Two times a day (BID) | ORAL | Status: DC

## 2012-05-30 MED ORDER — PENICILLIN V POTASSIUM 250 MG PO TABS: 250.0000 mg | ORAL_TABLET | Freq: Four times a day (QID) | ORAL | Status: DC

## 2012-05-30 NOTE — ED Provider Notes (Signed)
History     CSN: [id number]  Arrival date & time 05/30/12  1021   First MD Initiated Contact with Patient 05/30/12 1032      Chief Complaint  Patient presents with  . Dental Pain     HPI Pt was seen at 1105.  Per pt, c/o gradual onset and persistence of constant left lower tooth "pain" for the past several months, worse over the past several days.  Denies fevers, no intra-oral edema, no rash, no facial swelling, no dysphagia, no neck pain.   The condition is aggravated by nothing. The condition is relieved by nothing. The symptoms have been associated with no other complaints. The patient has no significant history of serious medical conditions.     Past Medical History  Diagnosis Date  . Migraine   . Unspecified disease of the salivary glands     Past Surgical History  Procedure Date  . Throat surgery      History  Substance Use Topics  . Smoking status: Current Every Day Smoker    Types: Cigarettes  . Smokeless tobacco: Not on file  . Alcohol Use: No      Review of Systems ROS: Statement: All systems negative except as marked or noted in the HPI; Constitutional: Negative for fever and chills. ; ; Eyes: Negative for eye pain and discharge. ; ; ENMT: Positive for dental caries, dental hygiene poor and toothache. Negative for ear pain, bleeding gums, dental injury, facial deformity, facial swelling, hoarseness, nasal congestion, sinus pressure, sore throat, throat swelling and tongue swollen. ; ; Cardiovascular: Negative for chest pain, palpitations, diaphoresis, dyspnea and peripheral edema. ; ; Respiratory: Negative for cough, wheezing and stridor. ; ; Gastrointestinal: Negative for nausea, vomiting, diarrhea and abdominal pain. ; ; Genitourinary: Negative for dysuria, flank pain and hematuria. ; ; Musculoskeletal: Negative for back pain and neck pain. ; ; Skin: Negative for rash and skin lesion. ; ; Neuro: Negative for headache, lightheadedness and neck stiffness. ;     Allergies  Review of patient's allergies indicates no known allergies.  Home Medications   Current Outpatient Rx  Name Route Sig Dispense Refill  . IBUPROFEN 200 MG PO TABS Oral Take 600 mg by mouth as needed. Pain    . NAPROXEN 250 MG PO TABS Oral Take 250 mg by mouth 2 (two) times daily as needed. Pain    . HYDROCODONE-ACETAMINOPHEN 5-325 MG PO TABS  1 or 2 tabs PO q6 hours prn pain 15 tablet 0  . NAPROXEN 250 MG PO TABS Oral Take 1 tablet (250 mg total) by mouth 2 (two) times daily with a meal. 14 tablet 0  . PENICILLIN V POTASSIUM 250 MG PO TABS Oral Take 1 tablet (250 mg total) by mouth 4 (four) times daily. 20 tablet 0    BP 152/82  Pulse 79  Temp 97.6 F (36.4 C)  Resp 20  Ht 6\' 1"  (1.854 m)  Wt 180 lb (81.647 kg)  BMI 23.75 kg/m2  SpO2 100%  Physical Exam 1110: Physical examination: Vital signs and O2 SAT: Reviewed; Constitutional: Well developed, Well nourished, Well hydrated, In no acute distress; Head and Face: Normocephalic, Atraumatic; Eyes: EOMI, PERRL, No scleral icterus; ENMT: Mouth and pharynx normal, Poor dentition, Widespread dental decay, Left TM normal, Right TM normal, Mucous membranes moist, +lower left 2nd molar with dental decay.  No gingival erythema, edema, fluctuance, or drainage.  No hoarse voice, no drooling, no stridor.  ; Neck: Supple, Full range of motion, No  lymphadenopathy; Cardiovascular: Regular rate and rhythm, No murmur, rub, or gallop; Respiratory: Breath sounds clear & equal bilaterally, No rales, rhonchi, wheezes, or rub, Normal respiratory effort/excursion; Chest: Nontender, Movement normal; Extremities: Pulses normal, No tenderness, No edema; Neuro: AA&Ox3, Major CN grossly intact.  No gross focal motor or sensory deficits in extremities.; Skin: Color normal, No rash, No petechiae, Warm, Dry   ED Course  Procedures     MDM  MDM Reviewed: nursing note, vitals and previous chart      1115:  Pt encouraged to f/u with dentist or  oral surgeon for his dental needs for good continuity of care and definitive treatment.  Verb understanding.        Laray Anger, DO 05/30/12 Prentice Docker

## 2012-05-30 NOTE — Discharge Instructions (Signed)
RESOURCE GUIDE  Chronic Pain Problems: Contact [facility]  [phone] Patients need to be referred by their primary care doctor.  Insufficient Money for Medicine: Contact [employer] Way:  call "211" or [facility] [phone].  No Primary Care Doctor: - Call [facility]  [phone] - can help you locate a primary care doctor that  accepts your insurance, provides certain services, etc. - Physician Referral Service- 1-800-533-3463  Agencies that provide inexpensive medical care: - [facility]  [phone] - [facility]  [phone] - [facility]  [phone] - [facility]  [phone] - [facility]  [phone] - [facility]  [phone]  Medicaid-accepting [place] Providers: - [facility]- [address]  [phone], Mon-Fri 9am-7pm, Sat 9am-1pm - [facility]- [address], [address]  [phone] - [facility]- [address], [address]  [phone] - [facility]- [address] [place] [address]  [phone] - Ve[facility] B[facility]- [address], [address], [phone]  Only accepts [place] Access Medicaid patients after they have their name  applied to their card  Self Pay (no insurance) in [place]: - Sickle Cell Patients: Dr Eric Dean, [facility]  [address], [phone] - [facility]- [address]  [phone]       -     [facility]- [address]       -     [facility]- see information above (Speak to Pam H if you do not have insurance)       -  [facility]- [address], [phone]       -  [facility] [place]- [address],  [phone]       -  [facility]- [address] [place] [address], [phone]       -  Dr Osei-Bonsu-  [address], [place], [phone]       -  [facility]- [address], [phone]       -  [facility] - [address] [place] [address], [phone], also [address], [phone]       -    [facility]- [address], [phone], 1st & 3rd Saturday   every month, 10am-1pm  1) Find a Doctor and Pay Out of Pocket Although you won't have to find out who is covered by your insurance plan, it is a good idea to ask around and get recommendations. You will then need to call the office and see if the doctor you have chosen will accept you as a new patient and what types of options they offer for patients who are self-pay. Some doctors offer discounts or will set up payment plans for their patients who do not have insurance, but you will need to ask so you aren't surprised when you get to your appointment.  2) Contact Your Local Health Department Not all health departments have doctors that can see patients for sick visits, but many do, so it is worth a call to see if yours does. If you don't know where your local health department is, you can check in your phone book. The CDC also has a tool to help you locate your state's health department, and many state websites also have   listings of all of their local health departments.  3) Find a Walk-in Clinic If your illness is not likely to be very severe or complicated, you may want to try a walk in clinic. These are popping up all over the country in pharmacies, drugstores, and shopping centers. They're usually staffed by nurse practitioners or physician assistants that have been trained to treat common illnesses and complaints. They're usually fairly quick and inexpensive. However, if you have serious medical issues or chronic medical problems, these are probably not your best option  STD Testing - [place] [facility] [place], [address], [place], phone [phone] or 1-877-539-9860.  Monday - Friday, call for an appointment. - [place]  [facility] [place], [facility], [address], [place], phone [phone] or 1-877-539-9860.  Monday - Friday, call for an appointment.  Abuse/Neglect: - [place] Child Abuse Hotline (336) 641-3795 - [place] Child Abuse Hotline 800-378-5315 (After Hours)  Emergency Shelter:  [facility] (336) 271-5985  Maternity Homes: - [facility] (336) 275-9566 - [facility] (704) 372-4663  MRSA Hotline #:   832-7006  [place] Resources  [facility] [place]  [employer] [place] [facility] [address]                 [address]         [address]  [place]                                               [place]                              [place] Phone:  [phone]                                  Phone:  [phone]                   Phone:  [phone]  [place] [facility], [phone] - [place] Services - [facility]- 1-888-581-9988       -     [facility] in [place], [address],                                  336-349-4454, Insurance  [place] Child Abuse Hotline (336) 342-1394 or (336) 342-3537 (After Hours)   Behavioral Health Services  Substance Abuse Resources: - [facility]  336-882-2125 - [facility] 336-784-9470 - [facility] 336-285-9073 - [facility] 336-845-3988 - Residential & Outpatient Substance Abuse Program  800-659-3381  Psychological Services: - [facility]  [phone] - [facility]  [phone] - [place] [facility], [address], [place], ACCESS LINE: 1-800-853-5163 or 336-641-4981, Http://www.[facility].com/services/adult.htm  Dental Assistance  If unable to pay or uninsured, contact:  [facility] or [place] [facility] to become qualified for the adult dental  clinic.  Patients with Medicaid: [facility] [address], [phone], [phone]  If unable   to pay, or uninsured, contact [facility] ([phone]) or [place] [facility] ([phone] in [place], [phone] in [place]) to become qualified for the adult dental clinic  Other Low-Cost Community Dental Services: - [facility]- [address], [place], [place], [address], [phone], Ext. [address], 2nd and 4th Thursday of the month at 6:30am.  10 clients each day by appointment, can sometimes see walk-in patients if someone does not show for an appointment. - [facility]- [address], [place], [place], [address], [phone] - [facility]- [address], [place], , [address], [phone] - [place] [facility]- [phone] - [facility]- [phone] - [facility]- [phone]     Take the prescriptions as directed.  Call your regular dentist today to schedule a follow up appointment within the next week.  Return to the Emergency Department immediately sooner if worsening.   

## 2012-05-30 NOTE — ED Notes (Signed)
Pt c/o left upper and lower dental pain, states that he has had problems with his teeth for "months", became worse two weeks ago,

## 2012-06-12 IMAGING — CR DG WRIST COMPLETE 3+V*L*
2 series · 2 of 2 positions shown · non-contrast
Comparison: None.

CLINICAL DATA: Some down steps.  Fell on outstretched hand.  Wrist
pain.

LEFT WRIST - COMPLETE 3+ VIEW

[view not recorded (1 of 2)]
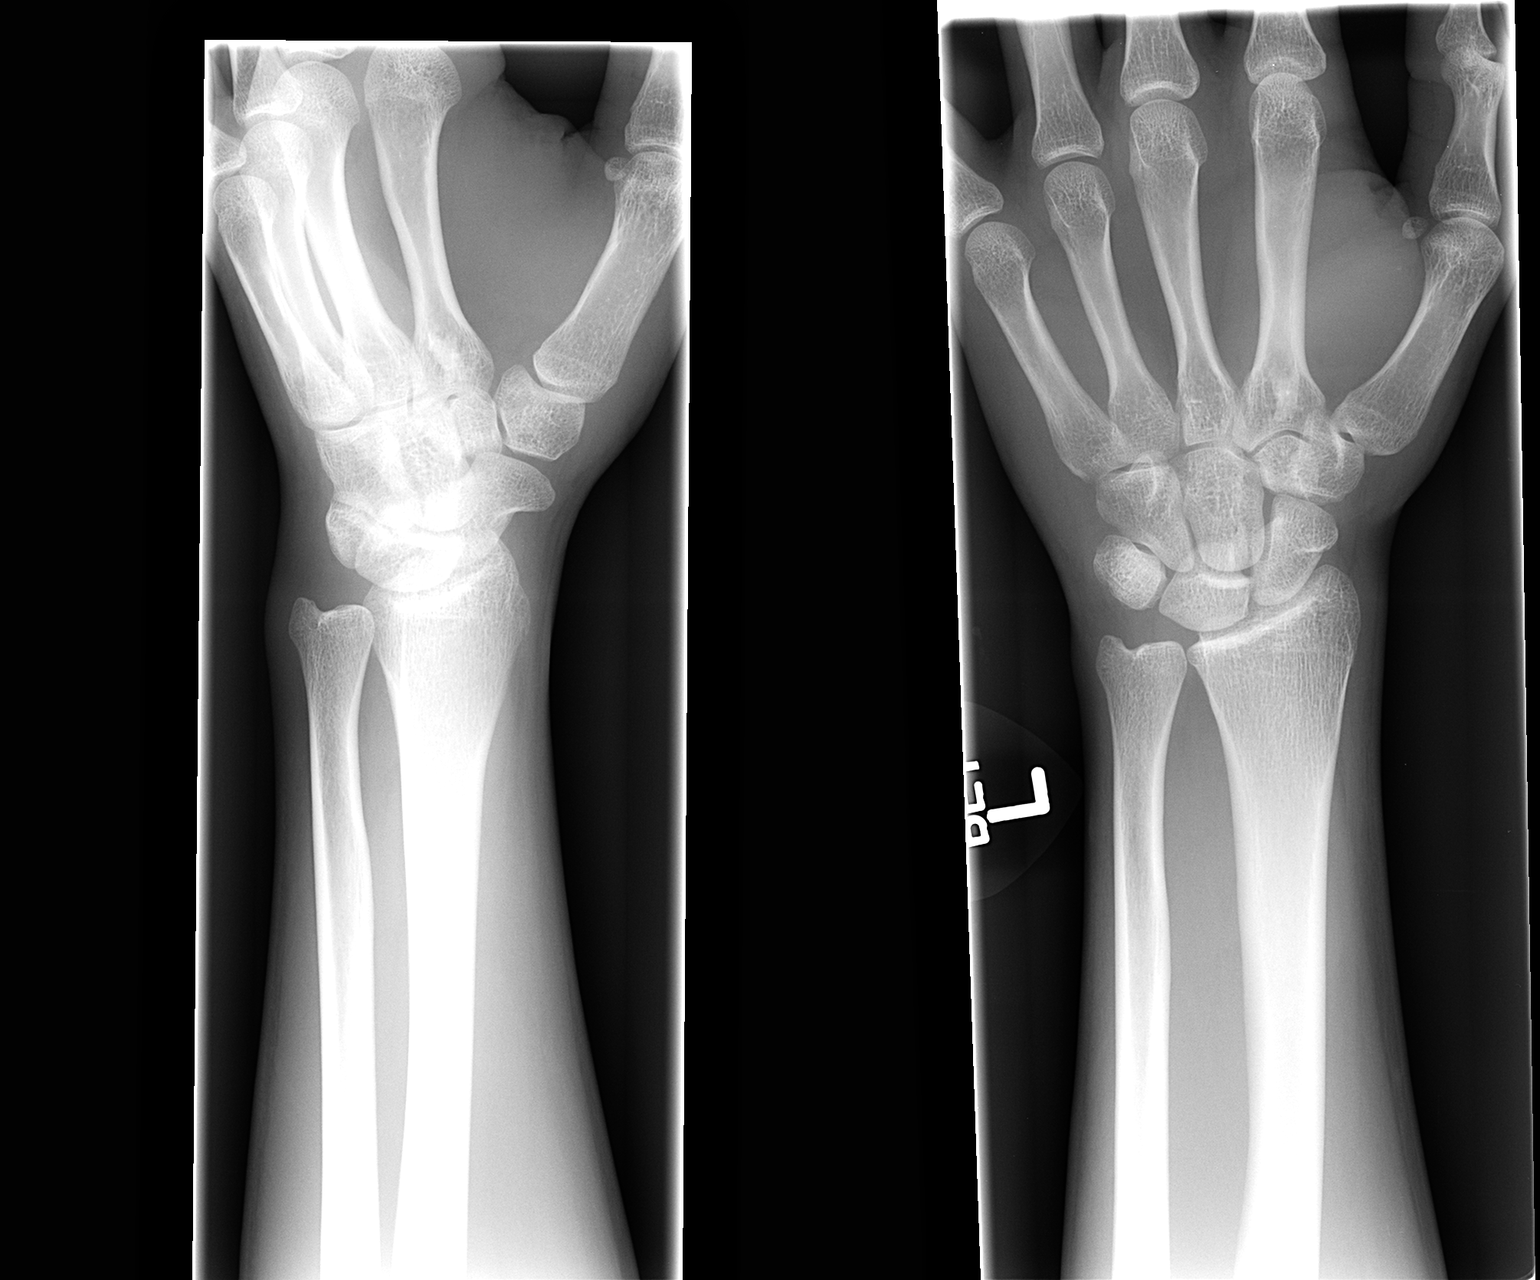

[view not recorded (2 of 2)]
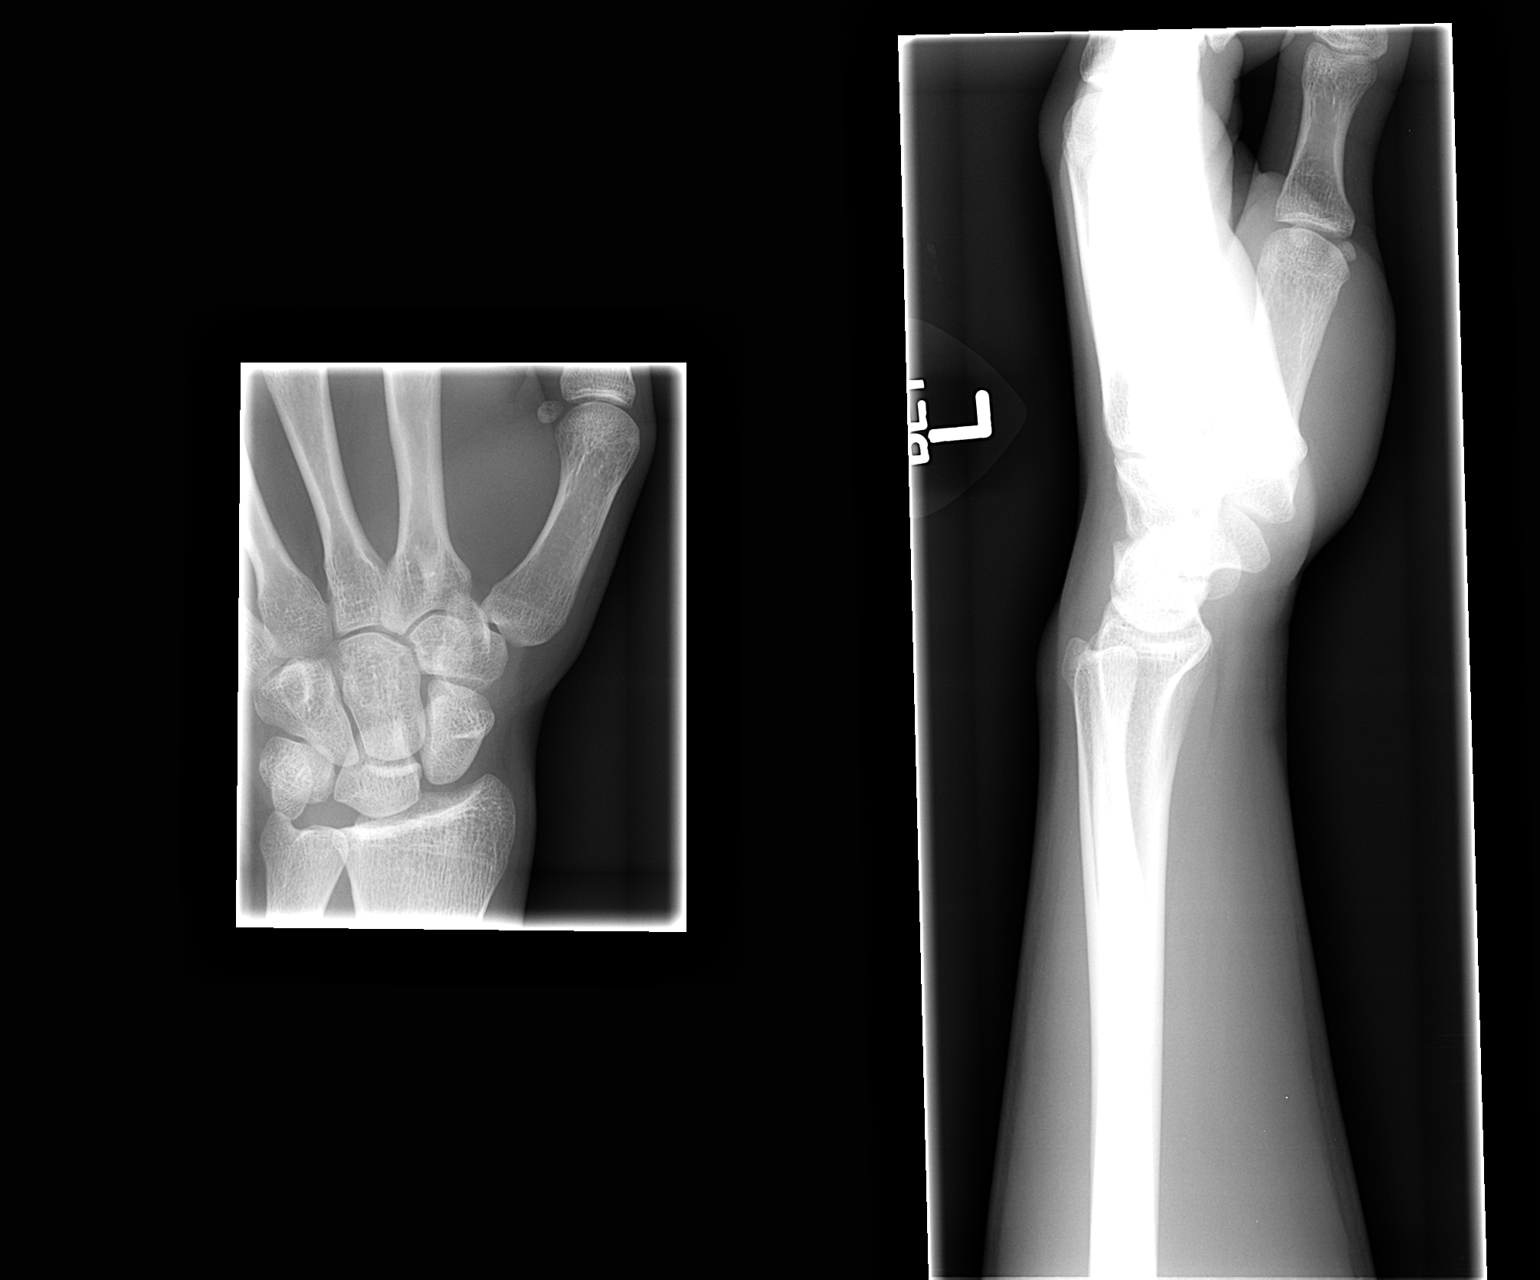

[2 of 2 positions shown; findings below may reference images not displayed]

FINDINGS: There is no evidence for acute fracture or dislocation.
No soft tissue foreign body or gas identified.  Intercarpal spaces
are normal.
IMPRESSION: Negative exam.

## 2012-06-19 ENCOUNTER — Emergency Department (HOSPITAL_COMMUNITY)
Admission: EM | Admit: 2012-06-19 | Discharge: 2012-06-19 | Disposition: A | Discharge status: Home / Self Care | Payer: BC Managed Care – PPO | Attending: Emergency Medicine | Admitting: Emergency Medicine

## 2012-06-19 ENCOUNTER — Encounter (HOSPITAL_COMMUNITY): Payer: Self-pay | Admitting: *Deleted

## 2012-06-19 DIAGNOSIS — G43909 Migraine, unspecified, not intractable, without status migrainosus: Secondary | ICD-10-CM | POA: Insufficient documentation

## 2012-06-19 DIAGNOSIS — F17200 Nicotine dependence, unspecified, uncomplicated: Secondary | ICD-10-CM | POA: Insufficient documentation

## 2012-06-19 DIAGNOSIS — K089 Disorder of teeth and supporting structures, unspecified: Secondary | ICD-10-CM | POA: Insufficient documentation

## 2012-06-19 DIAGNOSIS — K0889 Other specified disorders of teeth and supporting structures: Secondary | ICD-10-CM

## 2012-06-19 DIAGNOSIS — K119 Disease of salivary gland, unspecified: Secondary | ICD-10-CM | POA: Insufficient documentation

## 2012-06-19 DIAGNOSIS — Z79899 Other long term (current) drug therapy: Secondary | ICD-10-CM | POA: Insufficient documentation

## 2012-06-19 MED ORDER — PENICILLIN V POTASSIUM 500 MG PO TABS: 500.0000 mg | ORAL_TABLET | Freq: Four times a day (QID) | ORAL | Status: AC

## 2012-06-19 MED ORDER — HYDROCODONE-ACETAMINOPHEN 5-325 MG PO TABS: 1.0000 | ORAL_TABLET | Freq: Four times a day (QID) | ORAL | Status: AC | PRN

## 2012-06-19 NOTE — ED Provider Notes (Signed)
Medical screening examination/treatment/procedure(s) were performed by non-physician practitioner and as supervising physician I was immediately available for consultation/collaboration.   Vina Byrd, MD 06/19/12 1613 

## 2012-06-19 NOTE — ED Notes (Signed)
Alert, nad, here for evaluation of toothache, Seen by Neville Route, PA.

## 2012-06-19 NOTE — ED Notes (Signed)
Pt presents with left back molar pain since september. No edema noted.

## 2012-06-19 NOTE — ED Provider Notes (Signed)
History     CSN: [id number]  Arrival date & time 06/19/12  1458   First MD Initiated Contact with Patient 06/19/12 1512      Chief Complaint  Patient presents with  . Dental Pain    (Consider location/radiation/quality/duration/timing/severity/associated sxs/prior treatment) HPI Comments: Tooth has been hurting since sept.  No fever or chills.  Patient is a 21 y.o. male presenting with tooth pain. The history is provided by the patient. No language interpreter was used.  Dental PainThe primary symptoms include mouth pain. Primary symptoms do not include dental injury or fever. The symptoms are worsening. The symptoms occur constantly.  Additional symptoms include: jaw pain. Additional symptoms do not include: gum swelling and facial swelling.    Past Medical History  Diagnosis Date  . Migraine   . Unspecified disease of the salivary glands     Past Surgical History  Procedure Date  . Throat surgery     No family history on file.  History  Substance Use Topics  . Smoking status: Current Every Day Smoker    Types: Cigarettes  . Smokeless tobacco: Not on file  . Alcohol Use: No      Review of Systems  Constitutional: Negative for fever and chills.  HENT: Positive for dental problem. Negative for facial swelling.   All other systems reviewed and are negative.    Allergies  Review of patient's allergies indicates no known allergies.  Home Medications   Current Outpatient Rx  Name  Route  Sig  Dispense  Refill  . HYDROCODONE-ACETAMINOPHEN 5-325 MG PO TABS      1 or 2 tabs PO q6 hours prn pain   15 tablet   0   . HYDROCODONE-ACETAMINOPHEN 5-325 MG PO TABS   Oral   Take 1 tablet by mouth every 6 (six) hours as needed for pain.   15 tablet   0   . IBUPROFEN 200 MG PO TABS   Oral   Take 600 mg by mouth as needed. Pain         . NAPROXEN 250 MG PO TABS   Oral   Take 250 mg by mouth 2 (two) times daily as needed. Pain         . NAPROXEN 250 MG  PO TABS   Oral   Take 1 tablet (250 mg total) by mouth 2 (two) times daily with a meal.   14 tablet   0   . PENICILLIN V POTASSIUM 250 MG PO TABS   Oral   Take 1 tablet (250 mg total) by mouth 4 (four) times daily.   20 tablet   0   . PENICILLIN V POTASSIUM 500 MG PO TABS   Oral   Take 1 tablet (500 mg total) by mouth 4 (four) times daily.   40 tablet   0     BP 177/90  Pulse 88  Temp 97.4 F (36.3 C) (Oral)  Resp 20  Ht 6\' 1"  (1.854 m)  Wt 180 lb (81.647 kg)  BMI 23.75 kg/m2  SpO2 99%  Physical Exam  Nursing note and vitals reviewed. Constitutional: He is oriented to person, place, and time. He appears well-developed and well-nourished.  HENT:  Head: Normocephalic and atraumatic.  Mouth/Throat: Uvula is midline and mucous membranes are normal. No dental abscesses or uvula swelling.    Eyes: EOM are normal.  Neck: Normal range of motion.  Cardiovascular: Normal rate, regular rhythm and intact distal pulses.   Pulmonary/Chest: Effort normal. No  respiratory distress.  Abdominal: Soft. He exhibits no distension. There is no tenderness.  Musculoskeletal: Normal range of motion.  Neurological: He is alert and oriented to person, place, and time.  Skin: Skin is warm and dry.  Psychiatric: He has a normal mood and affect. Judgment normal.    ED Course  Procedures (including critical care time)  Labs Reviewed - No data to display No results found.   1. Pain, dental       MDM  rx-penicillin VK 500 mg QID x 10 days rx-hydrocodone, 15 Ibuprofen F/u with dentist ASAP        Evalina Field, PA 06/19/12 1523

## 2012-06-29 ENCOUNTER — Emergency Department (HOSPITAL_COMMUNITY)
Admission: EM | Admit: 2012-06-29 | Discharge: 2012-06-29 | Disposition: A | Discharge status: Home / Self Care | Payer: BC Managed Care – PPO | Attending: Emergency Medicine | Admitting: Emergency Medicine

## 2012-06-29 ENCOUNTER — Encounter (HOSPITAL_COMMUNITY): Payer: Self-pay | Admitting: *Deleted

## 2012-06-29 DIAGNOSIS — K089 Disorder of teeth and supporting structures, unspecified: Secondary | ICD-10-CM | POA: Insufficient documentation

## 2012-06-29 DIAGNOSIS — K0889 Other specified disorders of teeth and supporting structures: Secondary | ICD-10-CM

## 2012-06-29 DIAGNOSIS — K029 Dental caries, unspecified: Secondary | ICD-10-CM | POA: Insufficient documentation

## 2012-06-29 DIAGNOSIS — G43909 Migraine, unspecified, not intractable, without status migrainosus: Secondary | ICD-10-CM | POA: Insufficient documentation

## 2012-06-29 DIAGNOSIS — F17200 Nicotine dependence, unspecified, uncomplicated: Secondary | ICD-10-CM | POA: Insufficient documentation

## 2012-06-29 MED ORDER — HYDROCODONE-ACETAMINOPHEN 5-325 MG PO TABS
2.0000 | ORAL_TABLET | Freq: Once | ORAL | Status: AC
  Administered 2012-06-29: 2 via ORAL
  Filled 2012-06-29: qty 2

## 2012-06-29 MED ORDER — HYDROCODONE-ACETAMINOPHEN 5-325 MG PO TABS: 1.0000 | ORAL_TABLET | ORAL | Status: AC | PRN

## 2012-06-29 MED ORDER — PROMETHAZINE HCL 12.5 MG PO TABS
12.5000 mg | ORAL_TABLET | Freq: Once | ORAL | Status: AC
  Administered 2012-06-29: 12.5 mg via ORAL
  Filled 2012-06-29: qty 1

## 2012-06-29 NOTE — ED Notes (Signed)
Dental pain lt molar ,  Seen here for same .

## 2012-06-29 NOTE — ED Notes (Signed)
Pt returns to ED secondary to left lower back molar pain. Pt states he does not have a job and therefore cannot [facility] at [facility] and due to financial reasons can't be seen by dentist. No facial swelling or gum swelling noted. Pt reports using BC powders to decreased pain and placing BC powders on tooth itself. NAD noted.

## 2012-06-29 NOTE — ED Provider Notes (Signed)
Medical screening examination/treatment/procedure(s) were performed by non-physician practitioner and as supervising physician I was immediately available for consultation/collaboration.   Taylor Spilde, MD 06/29/12 1514 

## 2012-06-29 NOTE — ED Provider Notes (Signed)
History     CSN: [id number]  Arrival date & time 06/29/12  1053   First MD Initiated Contact with Patient 06/29/12 1207      Chief Complaint  Patient presents with  . Dental Pain    (Consider location/radiation/quality/duration/timing/severity/associated sxs/prior treatment) Patient is a 21 y.o. male presenting with tooth pain. The history is provided by the patient.  Dental PainThe primary symptoms include mouth pain. Primary symptoms do not include shortness of breath or cough. The symptoms are unchanged. The symptoms are chronic. The symptoms occur frequently.  Additional symptoms include: gum swelling and jaw pain. Additional symptoms do not include: nosebleeds. Medical issues include: smoking.    Past Medical History  Diagnosis Date  . Migraine   . Unspecified disease of the salivary glands     Past Surgical History  Procedure Date  . Throat surgery     History reviewed. No pertinent family history.  History  Substance Use Topics  . Smoking status: Current Every Day Smoker    Types: Cigarettes  . Smokeless tobacco: Not on file  . Alcohol Use: No      Review of Systems  Constitutional: Negative for activity change.       All ROS Neg except as noted in HPI  HENT: Positive for dental problem. Negative for nosebleeds and neck pain.   Eyes: Negative for photophobia and discharge.  Respiratory: Negative for cough, shortness of breath and wheezing.   Cardiovascular: Negative for chest pain and palpitations.  Gastrointestinal: Negative for abdominal pain and blood in stool.  Genitourinary: Negative for dysuria, frequency and hematuria.  Musculoskeletal: Negative for back pain and arthralgias.  Skin: Negative.   Neurological: Negative for dizziness, seizures and speech difficulty.  Psychiatric/Behavioral: Negative for hallucinations and confusion.    Allergies  Review of patient's allergies indicates no known allergies.  Home Medications   Current  Outpatient Rx  Name  Route  Sig  Dispense  Refill  . HYDROCODONE-ACETAMINOPHEN 5-325 MG PO TABS      1 or 2 tabs PO q6 hours prn pain   15 tablet   0   . HYDROCODONE-ACETAMINOPHEN 5-325 MG PO TABS   Oral   Take 1 tablet by mouth every 6 (six) hours as needed for pain.   15 tablet   0   . IBUPROFEN 200 MG PO TABS   Oral   Take 600 mg by mouth as needed. Pain         . NAPROXEN 250 MG PO TABS   Oral   Take 250 mg by mouth 2 (two) times daily as needed. Pain         . NAPROXEN 250 MG PO TABS   Oral   Take 1 tablet (250 mg total) by mouth 2 (two) times daily with a meal.   14 tablet   0   . PENICILLIN V POTASSIUM 250 MG PO TABS   Oral   Take 1 tablet (250 mg total) by mouth 4 (four) times daily.   20 tablet   0     BP 152/85  Pulse 86  Temp 98 F (36.7 C) (Oral)  Resp 18  Ht 6\' 1"  (1.854 m)  Wt 180 lb (81.647 kg)  BMI 23.75 kg/m2  SpO2 100%  Physical Exam  Nursing note and vitals reviewed. Constitutional: He is oriented to person, place, and time. He appears well-developed and well-nourished.  Non-toxic appearance.  HENT:  Head: Normocephalic.  Right Ear: Tympanic membrane and external ear normal.  Left Ear: Tympanic membrane and external ear normal.  Mouth/Throat:    Eyes: EOM and lids are normal. Pupils are equal, round, and reactive to light.  Neck: Normal range of motion. Neck supple. Carotid bruit is not present.  Cardiovascular: Normal rate, regular rhythm, normal heart sounds, intact distal pulses and normal pulses.   Pulmonary/Chest: Breath sounds normal. No respiratory distress.  Abdominal: Soft. Bowel sounds are normal. There is no tenderness. There is no guarding.  Musculoskeletal: Normal range of motion.  Lymphadenopathy:       Head (right side): No submandibular adenopathy present.       Head (left side): No submandibular adenopathy present.    He has no cervical adenopathy.  Neurological: He is alert and oriented to person, place, and  time. He has normal strength. No cranial nerve deficit or sensory deficit.  Skin: Skin is warm and dry.  Psychiatric: He has a normal mood and affect. His speech is normal.    ED Course  Procedures (including critical care time)  Labs Reviewed - No data to display No results found.   No diagnosis found.    MDM  I have reviewed nursing notes, vital signs, and all appropriate lab and imaging results for this patient. The patient has had multiple visits for dental related problem. Patient states that he is trying to seek a Designer, industrial/product who can assist him with limited insurance and limited financial resources. Suggested to the patient to give a call to the school of dentistry at [facility]. The patient has not finished the last round of antibiotics that he was given. The mother states that he is taking" a lot of Advil and the powders", and they request him not to receive this particular medication. Prescription for Norco one every 4 hours as needed for pain given to the patient.       Kathie Dike, [place] 06/29/12 1257

## 2012-07-17 ENCOUNTER — Emergency Department (HOSPITAL_COMMUNITY)
Admission: EM | Admit: 2012-07-17 | Discharge: 2012-07-17 | Disposition: A | Discharge status: Home / Self Care | Payer: BC Managed Care – PPO | Attending: Emergency Medicine | Admitting: Emergency Medicine

## 2012-07-17 ENCOUNTER — Encounter (HOSPITAL_COMMUNITY): Payer: Self-pay | Admitting: Emergency Medicine

## 2012-07-17 DIAGNOSIS — R1110 Vomiting, unspecified: Secondary | ICD-10-CM | POA: Insufficient documentation

## 2012-07-17 DIAGNOSIS — K0889 Other specified disorders of teeth and supporting structures: Secondary | ICD-10-CM

## 2012-07-17 DIAGNOSIS — G8929 Other chronic pain: Secondary | ICD-10-CM | POA: Insufficient documentation

## 2012-07-17 DIAGNOSIS — F17200 Nicotine dependence, unspecified, uncomplicated: Secondary | ICD-10-CM | POA: Insufficient documentation

## 2012-07-17 DIAGNOSIS — Z8679 Personal history of other diseases of the circulatory system: Secondary | ICD-10-CM | POA: Insufficient documentation

## 2012-07-17 DIAGNOSIS — K089 Disorder of teeth and supporting structures, unspecified: Secondary | ICD-10-CM | POA: Insufficient documentation

## 2012-07-17 DIAGNOSIS — Z79899 Other long term (current) drug therapy: Secondary | ICD-10-CM | POA: Insufficient documentation

## 2012-07-17 DIAGNOSIS — R509 Fever, unspecified: Secondary | ICD-10-CM | POA: Insufficient documentation

## 2012-07-17 DIAGNOSIS — Z8719 Personal history of other diseases of the digestive system: Secondary | ICD-10-CM | POA: Insufficient documentation

## 2012-07-17 NOTE — ED Provider Notes (Signed)
History     CSN: [id number]  Arrival date & time 07/17/12  1206   First MD Initiated Contact with Patient 07/17/12 1307      Chief Complaint  Patient presents with  . Dental Pain     Patient is a 21 y.o. male presenting with tooth pain. The history is provided by the patient.  Dental PainThe primary symptoms include mouth pain and fever. The symptoms are unchanged. The symptoms are chronic. The symptoms occur constantly.   Pt is here for further care of his dental pain.  He reports he is supposed to f/u at [facility] for his dental pain, but needs "temporary solution" for this dental pain Past Medical History  Diagnosis Date  . Migraine   . Unspecified disease of the salivary glands     Past Surgical History  Procedure Date  . Throat surgery     History reviewed. No pertinent family history.  History  Substance Use Topics  . Smoking status: Current Every Day Smoker    Types: Cigarettes  . Smokeless tobacco: Not on file  . Alcohol Use: No      Review of Systems  Constitutional: Positive for fever.  Gastrointestinal: Positive for vomiting.    Allergies  Review of patient's allergies indicates no known allergies.  Home Medications   Current Outpatient Rx  Name  Route  Sig  Dispense  Refill  . HYDROCODONE-ACETAMINOPHEN 5-325 MG PO TABS      1 or 2 tabs PO q6 hours prn pain   15 tablet   0   . IBUPROFEN 200 MG PO TABS   Oral   Take 600 mg by mouth as needed. Pain         . NAPROXEN 250 MG PO TABS   Oral   Take 250 mg by mouth 2 (two) times daily as needed. Pain         . NAPROXEN 250 MG PO TABS   Oral   Take 1 tablet (250 mg total) by mouth 2 (two) times daily with a meal.   14 tablet   0   . PENICILLIN V POTASSIUM 250 MG PO TABS   Oral   Take 1 tablet (250 mg total) by mouth 4 (four) times daily.   20 tablet   0     BP 126/53  Pulse 70  Temp 98 F (36.7 C) (Oral)  Resp 18  SpO2 100%  Physical Exam CONSTITUTIONAL: Well developed/well  nourished HEAD AND FACE: Normocephalic/atraumatic EYES: EOMI ENMT: Mucous membranes moist.  Poor dentition.  No trismus.  No focal abscess noted. NECK: supple no meningeal signs CV: S1/S2 noted, no murmurs/rubs/gallops noted LUNGS: Lungs are clear to auscultation bilaterally, no apparent distress ABDOMEN: soft NEURO: Pt is awake/alert, moves all extremitiesx4 EXTREMITIES:full ROM SKIN: warm, color normal  ED Course  Procedures   1. Pain, dental    Advised pt need for f/u as outpatient and that ER is not long term solution for dental pain   MDM  Nursing notes including past medical history and social history reviewed and considered in documentation Previous records reviewed and considered - multiple ED visits for dental pain         Joya Gaskins, MD 07/17/12 1503

## 2012-07-17 NOTE — ED Notes (Signed)
Pt c/o left side dental pain x3 weeks. Pt states he has been seen here before for same problem.

## 2012-07-17 NOTE — ED Notes (Signed)
Unable to locate pt for discharge papers, papers left at reg. Desk for pt if he returns.  Message left at contact number for pt.

## 2012-08-12 ENCOUNTER — Emergency Department (HOSPITAL_COMMUNITY)
Admission: EM | Admit: 2012-08-12 | Discharge: 2012-08-12 | Disposition: A | Discharge status: Home / Self Care | Payer: BC Managed Care – PPO | Attending: Emergency Medicine | Admitting: Emergency Medicine

## 2012-08-12 ENCOUNTER — Encounter (HOSPITAL_COMMUNITY): Payer: Self-pay | Admitting: *Deleted

## 2012-08-12 DIAGNOSIS — F17200 Nicotine dependence, unspecified, uncomplicated: Secondary | ICD-10-CM | POA: Insufficient documentation

## 2012-08-12 DIAGNOSIS — K047 Periapical abscess without sinus: Secondary | ICD-10-CM

## 2012-08-12 DIAGNOSIS — Z8679 Personal history of other diseases of the circulatory system: Secondary | ICD-10-CM | POA: Insufficient documentation

## 2012-08-12 MED ORDER — HYDROCODONE-ACETAMINOPHEN 5-325 MG PO TABS
1.0000 | ORAL_TABLET | Freq: Once | ORAL | Status: AC
  Administered 2012-08-12: 1 via ORAL
  Filled 2012-08-12: qty 1

## 2012-08-12 MED ORDER — AMOXICILLIN 250 MG PO CAPS
500.0000 mg | ORAL_CAPSULE | Freq: Once | ORAL | Status: AC
  Administered 2012-08-12: 500 mg via ORAL
  Filled 2012-08-12: qty 2

## 2012-08-12 MED ORDER — AMOXICILLIN 500 MG PO CAPS: 500.0000 mg | ORAL_CAPSULE | Freq: Three times a day (TID) | ORAL | Status: DC

## 2012-08-12 MED ORDER — HYDROCODONE-ACETAMINOPHEN 5-325 MG PO TABS: 1.0000 | ORAL_TABLET | ORAL | Status: AC | PRN

## 2012-08-12 NOTE — ED Notes (Signed)
Patient with no complaints at this time. Respirations even and unlabored. Skin warm/dry. Discharge instructions reviewed with parent at this time. Parent given opportunity to voice concerns/ask questions.Patient discharged at this time and left Emergency Department with steady gait.   

## 2012-08-12 NOTE — ED Notes (Signed)
Has been dealing w/lower L bottom tooth pain x 4 months.  Trying to get referral to dentist.  States filling is falling out, sensitive to cold and hot.  Has been taking ibuprofen 2000 mg daily for pain.  Has started getting nauseated x 3 days.

## 2012-08-12 NOTE — ED Notes (Signed)
Patient requesting referral to dentist 

## 2012-08-12 NOTE — ED Provider Notes (Signed)
History     CSN: [id number]  Arrival date & time 08/12/12  2136   First MD Initiated Contact with Patient 08/12/12 2215      Chief Complaint  Patient presents with  . Dental Pain    (Consider location/radiation/quality/duration/timing/severity/associated sxs/prior treatment) HPI Comments: Walter Benitez  presents with a  Several month history of chronic left lower 3rd molar tooth pain and swelling.  He has made sevral attempts to get the tooth pulled without success as he does not qualify for the [facility] or [facility] dental school with no income and has not been able to see a dentist due to the expense,  Stating he is covered by his fathers insurance,  But cannot afford the copay to be seen.  He has developed worsened pain again,  And now has drainage from a small blister next to this third molar tooth.  He has had a bad taste in his mouth which leaves him nausueated.     There has been no fevers,  Chills or vomiting, also no complaint of difficulty swallowing,  Although chewing makes pain worse.  The patient has tried ibuprofen without relief of symptoms.      The history is provided by the patient.    Past Medical History  Diagnosis Date  . Migraine   . Unspecified disease of the salivary glands     Past Surgical History  Procedure Date  . Throat surgery     History reviewed. No pertinent family history.  History  Substance Use Topics  . Smoking status: Current Every Day Smoker    Types: Cigarettes  . Smokeless tobacco: Not on file  . Alcohol Use: No      Review of Systems  Constitutional: Negative for fever.  HENT: Positive for dental problem. Negative for sore throat, facial swelling, neck pain and neck stiffness.   Respiratory: Negative for shortness of breath.     Allergies  Review of patient's allergies indicates no known allergies.  Home Medications   Current Outpatient Rx  Name  Route  Sig  Dispense  Refill  . IBUPROFEN 200 MG PO TABS  Oral   Take 800 mg by mouth every 6 (six) hours as needed. Pain         . AMOXICILLIN 500 MG PO CAPS   Oral   Take 1 capsule (500 mg total) by mouth 3 (three) times daily.   30 capsule   0   . HYDROCODONE-ACETAMINOPHEN 5-325 MG PO TABS   Oral   Take 1 tablet by mouth every 4 (four) hours as needed for pain.   15 tablet   0     BP 168/77  Pulse 92  Temp 98.2 F (36.8 C) (Oral)  Resp 16  Ht 6\' 2"  (1.88 m)  Wt 180 lb (81.647 kg)  BMI 23.11 kg/m2  SpO2 100%  Physical Exam  Constitutional: He is oriented to person, place, and time. He appears well-developed and well-nourished. No distress.  HENT:  Head: Normocephalic and atraumatic.  Right Ear: Tympanic membrane and external ear normal.  Left Ear: Tympanic membrane and external ear normal.  Mouth/Throat: Oropharynx is clear and moist and mucous membranes are normal. No oral lesions. Dental abscesses present.    Eyes: Conjunctivae normal are normal.  Neck: Normal range of motion. Neck supple.  Cardiovascular: Normal rate and normal heart sounds.   Pulmonary/Chest: Effort normal.  Abdominal: He exhibits no distension.  Musculoskeletal: Normal range of motion.  Lymphadenopathy:  He has no cervical adenopathy.  Neurological: He is alert and oriented to person, place, and time.  Skin: Skin is warm and dry. No erythema.  Psychiatric: He has a normal mood and affect.    ED Course  Procedures (including critical care time)  Labs Reviewed - No data to display No results found.   1. Dental abscess       MDM  Pt prescribed amoxil,  Hydrocodone.  Referrals given including local dental list and oral surgery in [place].        Burgess Amor, PA 08/12/12 2246  Burgess Amor, PA 08/12/12 2249

## 2012-08-12 NOTE — ED Notes (Signed)
Patient with no complaints at this time. Respirations even and unlabored. Skin warm/dry. Discharge instructions reviewed with patient at this time. Patient given opportunity to voice concerns/ask questions. Patient discharged at this time and left Emergency Department with steady gait.   

## 2012-08-12 NOTE — ED Provider Notes (Signed)
Medical screening examination/treatment/procedure(s) were performed by non-physician practitioner and as supervising physician I was immediately available for consultation/collaboration. Devoria Albe, MD, Armando Gang   Ward Givens, MD 08/12/12 [phone]

## 2012-08-21 ENCOUNTER — Emergency Department (HOSPITAL_COMMUNITY)
Admission: EM | Admit: 2012-08-21 | Discharge: 2012-08-21 | Disposition: A | Discharge status: Home / Self Care | Payer: BC Managed Care – PPO | Attending: Emergency Medicine | Admitting: Emergency Medicine

## 2012-08-21 ENCOUNTER — Encounter (HOSPITAL_COMMUNITY): Payer: Self-pay | Admitting: *Deleted

## 2012-08-21 DIAGNOSIS — K089 Disorder of teeth and supporting structures, unspecified: Secondary | ICD-10-CM | POA: Insufficient documentation

## 2012-08-21 DIAGNOSIS — K054 Periodontosis: Secondary | ICD-10-CM | POA: Insufficient documentation

## 2012-08-21 DIAGNOSIS — F17200 Nicotine dependence, unspecified, uncomplicated: Secondary | ICD-10-CM | POA: Insufficient documentation

## 2012-08-21 DIAGNOSIS — K0889 Other specified disorders of teeth and supporting structures: Secondary | ICD-10-CM

## 2012-08-21 DIAGNOSIS — Z8679 Personal history of other diseases of the circulatory system: Secondary | ICD-10-CM | POA: Insufficient documentation

## 2012-08-21 DIAGNOSIS — K0530 Chronic periodontitis, unspecified: Secondary | ICD-10-CM

## 2012-08-21 DIAGNOSIS — Z87898 Personal history of other specified conditions: Secondary | ICD-10-CM | POA: Insufficient documentation

## 2012-08-21 NOTE — ED Notes (Signed)
Dental pain for "months",  Has almost finished the antibiotic and out of pain meds

## 2012-08-21 NOTE — ED Provider Notes (Signed)
History     CSN: [id number]  Arrival date & time 08/21/12  1344   First MD Initiated Contact with Patient 08/21/12 1410      Chief Complaint  Patient presents with  . Dental Pain    (Consider location/radiation/quality/duration/timing/severity/associated sxs/prior treatment) HPI Pt with chronic dental pain for several months, multiple ED visits for same including 9 days ago. Still taking a 10 day course of Amoxil. Attempting dental follow up. Pain is moderate aching, worse with chewing, no drainage or fever.   Past Medical History  Diagnosis Date  . Migraine   . Unspecified disease of the salivary glands     Past Surgical History  Procedure Date  . Throat surgery     History reviewed. No pertinent family history.  History  Substance Use Topics  . Smoking status: Current Every Day Smoker    Types: Cigarettes  . Smokeless tobacco: Not on file  . Alcohol Use: No      Review of Systems All other systems reviewed and are negative except as noted in HPI.   Allergies  Review of patient's allergies indicates no known allergies.  Home Medications   Current Outpatient Rx  Name  Route  Sig  Dispense  Refill  . AMOXICILLIN 500 MG PO CAPS   Oral   Take 1 capsule (500 mg total) by mouth 3 (three) times daily.   30 capsule   0   . HYDROCODONE-ACETAMINOPHEN 5-325 MG PO TABS   Oral   Take 1 tablet by mouth every 4 (four) hours as needed for pain.   15 tablet   0   . IBUPROFEN 200 MG PO TABS   Oral   Take 800 mg by mouth every 6 (six) hours as needed. Pain           BP 150/66  Pulse 69  Temp 98.6 F (37 C) (Oral)  Resp 18  Ht 6\' 1"  (1.854 m)  Wt 175 lb (79.379 kg)  BMI 23.09 kg/m2  SpO2 99%  Physical Exam  Nursing note and vitals reviewed. Constitutional: He is oriented to person, place, and time. He appears well-developed and well-nourished.  HENT:  Head: Normocephalic and atraumatic.  Mouth/Throat:    Eyes: EOM are normal. Pupils are equal,  round, and reactive to light.  Neck: Normal range of motion. Neck supple.  Cardiovascular: Normal rate, normal heart sounds and intact distal pulses.   Pulmonary/Chest: Effort normal and breath sounds normal.  Abdominal: Bowel sounds are normal. He exhibits no distension. There is no tenderness.  Musculoskeletal: Normal range of motion. He exhibits no edema and no tenderness.  Lymphadenopathy:    He has no cervical adenopathy.  Neurological: He is alert and oriented to person, place, and time. He has normal strength. No cranial nerve deficit or sensory deficit.  Skin: Skin is warm and dry. No rash noted.  Psychiatric: He has a normal mood and affect.    ED Course  Procedures (including critical care time)  Labs Reviewed - No data to display No results found.   No diagnosis found.    MDM  Advised to continue Abx, no further narcotics from the ED.        Shaneese Tait B. Bernette Mayers, MD 08/21/12 1416

## 2012-09-03 ENCOUNTER — Encounter (HOSPITAL_COMMUNITY): Payer: Self-pay | Admitting: *Deleted

## 2012-09-03 ENCOUNTER — Emergency Department (HOSPITAL_COMMUNITY)
Admission: EM | Admit: 2012-09-03 | Discharge: 2012-09-03 | Disposition: A | Discharge status: Home / Self Care | Payer: BC Managed Care – PPO | Attending: Emergency Medicine | Admitting: Emergency Medicine

## 2012-09-03 DIAGNOSIS — K089 Disorder of teeth and supporting structures, unspecified: Secondary | ICD-10-CM | POA: Insufficient documentation

## 2012-09-03 DIAGNOSIS — Z8679 Personal history of other diseases of the circulatory system: Secondary | ICD-10-CM | POA: Insufficient documentation

## 2012-09-03 DIAGNOSIS — K0889 Other specified disorders of teeth and supporting structures: Secondary | ICD-10-CM

## 2012-09-03 DIAGNOSIS — F17200 Nicotine dependence, unspecified, uncomplicated: Secondary | ICD-10-CM | POA: Insufficient documentation

## 2012-09-03 DIAGNOSIS — Z8719 Personal history of other diseases of the digestive system: Secondary | ICD-10-CM | POA: Insufficient documentation

## 2012-09-03 DIAGNOSIS — K029 Dental caries, unspecified: Secondary | ICD-10-CM | POA: Insufficient documentation

## 2012-09-03 MED ORDER — AMOXICILLIN 500 MG PO CAPS: 500.0000 mg | ORAL_CAPSULE | Freq: Three times a day (TID) | ORAL | Status: DC

## 2012-09-03 MED ORDER — CHLORHEXIDINE GLUCONATE 0.12 % MT SOLN: 15.0000 mL | Freq: Two times a day (BID) | OROMUCOSAL | Status: DC

## 2012-09-03 NOTE — ED Notes (Signed)
Pain lt mandibular molar , Has been seen here for same. Says no money to see a dentist. But his mother plans to get his tooth pulled in Feb.

## 2012-09-03 NOTE — ED Notes (Signed)
Dental to left side "for months."  Seen here for same x 2 wks ago.

## 2012-09-03 NOTE — ED Provider Notes (Signed)
History     CSN: [id number]  Arrival date & time 09/03/12  1615   First MD Initiated Contact with Patient 09/03/12 1621      Chief Complaint  Patient presents with  . Dental Pain    (Consider location/radiation/quality/duration/timing/severity/associated sxs/prior treatment) HPI Comments: Patient c/o chronic pain to the left lower tooth for "months".  Seen here multiple times for same.  States that he has tried to get in to see a dentist, but does not have the funds to pay for a visit.  C/o recent drainage and tenderness of the gums along the affected tooth.  He denies facial swelling , difficulty swallowing or breathing or trismus.   Patient is a 22 y.o. male presenting with tooth pain. The history is provided by the patient.  Dental PainThe primary symptoms include mouth pain. Primary symptoms do not include oral bleeding, oral lesions, headaches, fever, shortness of breath, sore throat, angioedema or cough. The symptoms began more than 1 month ago. The symptoms are worsening. The symptoms are recurrent. The symptoms occur constantly.  Mouth pain began more than 1 month ago. Mouth pain occurs constantly. Mouth pain is unchanged. Affected locations include: teeth and gum(s).  Additional symptoms include: dental sensitivity to temperature and gum tenderness. Additional symptoms do not include: gum swelling, purulent gums, trismus, facial swelling, trouble swallowing, pain with swallowing, ear pain and swollen glands. Medical issues include: smoking and periodontal disease.    Past Medical History  Diagnosis Date  . Migraine   . Unspecified disease of the salivary glands     Past Surgical History  Procedure Date  . Throat surgery     No family history on file.  History  Substance Use Topics  . Smoking status: Current Every Day Smoker    Types: Cigarettes  . Smokeless tobacco: Not on file  . Alcohol Use: No      Review of Systems  Constitutional: Negative for fever and  appetite change.  HENT: Positive for dental problem. Negative for ear pain, congestion, sore throat, facial swelling, trouble swallowing, neck pain and neck stiffness.   Eyes: Negative for pain and visual disturbance.  Respiratory: Negative for cough and shortness of breath.   Neurological: Negative for dizziness, facial asymmetry and headaches.  Hematological: Negative for adenopathy.  All other systems reviewed and are negative.    Allergies  Review of patient's allergies indicates no known allergies.  Home Medications   Current Outpatient Rx  Name  Route  Sig  Dispense  Refill  . AMOXICILLIN 500 MG PO CAPS   Oral   Take 1 capsule (500 mg total) by mouth 3 (three) times daily.   30 capsule   0   . IBUPROFEN 200 MG PO TABS   Oral   Take 800 mg by mouth every 6 (six) hours as needed. Pain           BP 162/86  Pulse 78  Temp 98 F (36.7 C) (Oral)  Resp 20  Ht 6' (1.829 m)  Wt 180 lb (81.647 kg)  BMI 24.41 kg/m2  SpO2 96%  Physical Exam  Nursing note and vitals reviewed. Constitutional: He is oriented to person, place, and time. He appears well-developed and well-nourished. No distress.  HENT:  Head: Normocephalic and atraumatic. No trismus in the jaw.  Right Ear: Tympanic membrane and ear canal normal.  Left Ear: Tympanic membrane and ear canal normal.  Mouth/Throat: Uvula is midline, oropharynx is clear and moist and mucous membranes are normal.  Dental caries present. No dental abscesses or uvula swelling.    Eyes: EOM are normal. Pupils are equal, round, and reactive to light.  Neck: Normal range of motion. Neck supple.  Cardiovascular: Normal rate, regular rhythm and normal heart sounds.   No murmur heard. Pulmonary/Chest: Effort normal and breath sounds normal.  Musculoskeletal: Normal range of motion. He exhibits no edema.  Lymphadenopathy:    He has no cervical adenopathy.  Neurological: He is alert and oriented to person, place, and time. He exhibits  normal muscle tone. Coordination normal.  Skin: Skin is warm and dry.    ED Course  Procedures (including critical care time)  Labs Reviewed - No data to display     MDM    Previous ED charts reviewed.  Multiple ED visits since October for dental pain.  Pt is requesting abx, advised that he needs further dental management with a dentist.  No narcotics prescribed today.      Prescribed: amoxil peridex oral soln     Walter Benitez L. [place], [place] 09/03/12 1649

## 2012-09-04 NOTE — ED Provider Notes (Signed)
Medical screening examination/treatment/procedure(s) were performed by non-physician practitioner and as supervising physician I was immediately available for consultation/collaboration.   Shelda Jakes, MD 09/04/12 [phone]

## 2013-08-30 ENCOUNTER — Emergency Department (HOSPITAL_COMMUNITY)
Admission: EM | Admit: 2013-08-30 | Discharge: 2013-08-30 | Disposition: A | Discharge status: Home / Self Care | Payer: BC Managed Care – PPO | Attending: Emergency Medicine | Admitting: Emergency Medicine

## 2013-08-30 ENCOUNTER — Encounter (HOSPITAL_COMMUNITY): Payer: Self-pay | Admitting: Emergency Medicine

## 2013-08-30 DIAGNOSIS — Z8679 Personal history of other diseases of the circulatory system: Secondary | ICD-10-CM | POA: Insufficient documentation

## 2013-08-30 DIAGNOSIS — Z8719 Personal history of other diseases of the digestive system: Secondary | ICD-10-CM | POA: Insufficient documentation

## 2013-08-30 DIAGNOSIS — F17200 Nicotine dependence, unspecified, uncomplicated: Secondary | ICD-10-CM | POA: Insufficient documentation

## 2013-08-30 DIAGNOSIS — R599 Enlarged lymph nodes, unspecified: Secondary | ICD-10-CM | POA: Insufficient documentation

## 2013-08-30 DIAGNOSIS — R590 Localized enlarged lymph nodes: Secondary | ICD-10-CM

## 2013-08-30 DIAGNOSIS — J029 Acute pharyngitis, unspecified: Secondary | ICD-10-CM | POA: Insufficient documentation

## 2013-08-30 NOTE — ED Notes (Signed)
Patient states he has insurance but is unable to pay a copay associated, so he wouldn't be able to afford to go to ENT.    Patient asked about social work.   Will contact to come see patient.

## 2013-08-30 NOTE — Progress Notes (Signed)
CSW consult to pt for inability to co-pay. Pt has private coverage thru parents. Pt reports that he had surgery 2-3 years ago from a local ENT physician and feels he is experiencing the same pain and needs to return. Pt requested to see a Child psychotherapistsocial worker for resource for co-payment assistance. CSW consulted with [employer]  and was told there were no resources for private pay insurances. CSW suggested that pt call clinic to inquire about a payment plan in order to be seen. Pt voiced understanding and stated will call today. No further intervention needed.    [address]rynlyn [place], [place] [phone]

## 2013-08-30 NOTE — Discharge Instructions (Signed)
Follow up with Dr. Lazarus SalinesWolicki for further evaluation. Return to the ED for new or worsening symptoms.

## 2013-08-30 NOTE — ED Provider Notes (Signed)
CSN: [id number]     Arrival date & time 08/30/13  1031 History   First MD Initiated Contact with Patient 08/30/13 1059     Chief Complaint  Patient presents with  . Sore Throat   (Consider location/radiation/quality/duration/timing/severity/associated sxs/prior Treatment) The history is provided by the patient and medical records.   This is a 23 y.o. M presenting to the ED for sore throat and swelling to right side of his neck, onset 3 days ago.  States it is somewhat painful to swallow but no difficulty doing so.  Denies fevers, chills, cough, rhinorrhea, or other URI type sx.  Pt states he had prior left salivary gland surgery several years ago by ENT-- states he is concerned he may need to have this done on his right side.  VS stable on arrival.  Past Medical History  Diagnosis Date  . Migraine   . Unspecified disease of the salivary glands    Past Surgical History  Procedure Laterality Date  . Throat surgery     History reviewed. No pertinent family history. History  Substance Use Topics  . Smoking status: Current Every Day Smoker    Types: Cigarettes  . Smokeless tobacco: Not on file  . Alcohol Use: No    Review of Systems  Musculoskeletal: Positive for neck pain.  All other systems reviewed and are negative.    Allergies  Review of patient's allergies indicates no known allergies.  Home Medications   Current Outpatient Rx  Name  Route  Sig  Dispense  Refill  . ibuprofen (ADVIL,MOTRIN) 200 MG tablet   Oral   Take 800 mg by mouth every 6 (six) hours as needed for fever, headache or mild pain. Pain          BP 153/72  Pulse 73  Temp(Src) 97.5 F (36.4 C) (Oral)  Resp 18  SpO2 100%  Physical Exam  Nursing note and vitals reviewed. Constitutional: He is oriented to person, place, and time. He appears well-developed and well-nourished. No distress.  HENT:  Head: Normocephalic and atraumatic.  Mouth/Throat: Uvula is midline, oropharynx is clear and moist  and mucous membranes are normal. No oral lesions. No trismus in the jaw. No uvula swelling. No oropharyngeal exudate, posterior oropharyngeal edema, posterior oropharyngeal erythema or tonsillar abscesses.  Tonsils normal in appearance bilaterally without exudate; uvula mildine, no peritonsillar abscess; handling secretions appropriately; no difficulty swallowing; no trismus  Eyes: Conjunctivae and EOM are normal. Pupils are equal, round, and reactive to light.  Neck: Normal range of motion and full passive range of motion without pain. Neck supple. No rigidity.  Right anterior cervical lymphadenopathy; TTP No meningeal signs  Cardiovascular: Normal rate, regular rhythm and normal heart sounds.   Pulmonary/Chest: Effort normal and breath sounds normal. No respiratory distress. He has no wheezes.  Musculoskeletal: Normal range of motion.  Lymphadenopathy:    He has cervical adenopathy.  Neurological: He is alert and oriented to person, place, and time.  Skin: Skin is warm. He is not diaphoretic.  Psychiatric: He has a normal mood and affect.    ED Course  Procedures (including critical care time) Labs Review Labs Reviewed - No data to display Imaging Review No results found.  EKG Interpretation   None       MDM   1. Lymphadenopathy of right cervical region    PE findings not consistent with strep, however suspicious for right anterior cervical lymphadenopathy. Pt remains concerned for his right salivary glands and given pts hx  of prior surgery will refer back to ENT for further evaluation.  Pt has no difficulty swallowing and no airway compromise.  Discussed plan with pt, they agreed.  Return precautions advised.  Garlon HatchetLisa M Sanders, PA-C 08/30/13 1259  Garlon HatchetLisa M Sanders, PA-C 08/30/13 1301

## 2013-08-30 NOTE — ED Notes (Signed)
Pt reports that he started having swelling and a sore throat a couple of days ago. Reports he had surgery on his salivary glands a couple of years ago. Denies any fever or cough. Skin warm and dry

## 2013-08-30 NOTE — ED Provider Notes (Signed)
Medical screening examination/treatment/procedure(s) were performed by non-physician practitioner and as supervising physician I was immediately available for consultation/collaboration.  EKG Interpretation   None         Alesana Magistro, MD 08/30/13 1543 

## 2013-08-30 NOTE — ED Notes (Signed)
Social work called and is at bedside to speak with patient.

## 2013-08-31 ENCOUNTER — Encounter (HOSPITAL_COMMUNITY): Payer: Self-pay | Admitting: Emergency Medicine

## 2013-08-31 ENCOUNTER — Emergency Department (HOSPITAL_COMMUNITY)
Admission: EM | Admit: 2013-08-31 | Discharge: 2013-08-31 | Disposition: A | Discharge status: Home / Self Care | Payer: BC Managed Care – PPO | Attending: Emergency Medicine | Admitting: Emergency Medicine

## 2013-08-31 DIAGNOSIS — G43909 Migraine, unspecified, not intractable, without status migrainosus: Secondary | ICD-10-CM | POA: Insufficient documentation

## 2013-08-31 DIAGNOSIS — R221 Localized swelling, mass and lump, neck: Secondary | ICD-10-CM

## 2013-08-31 DIAGNOSIS — K115 Sialolithiasis: Secondary | ICD-10-CM | POA: Insufficient documentation

## 2013-08-31 DIAGNOSIS — F17200 Nicotine dependence, unspecified, uncomplicated: Secondary | ICD-10-CM | POA: Insufficient documentation

## 2013-08-31 DIAGNOSIS — R220 Localized swelling, mass and lump, head: Secondary | ICD-10-CM | POA: Insufficient documentation

## 2013-08-31 MED ORDER — CEPHALEXIN 500 MG PO CAPS: 500.0000 mg | ORAL_CAPSULE | Freq: Four times a day (QID) | ORAL | Status: DC

## 2013-08-31 MED ORDER — IBUPROFEN 800 MG PO TABS: 800.0000 mg | ORAL_TABLET | Freq: Three times a day (TID) | ORAL | Status: DC | PRN

## 2013-08-31 MED ORDER — HYDROCODONE-ACETAMINOPHEN 5-325 MG PO TABS: 1.0000 | ORAL_TABLET | ORAL | Status: DC | PRN

## 2013-08-31 NOTE — Progress Notes (Signed)
Case Manager consult for patient assistance.Met patient at bedside.Role of CM explained.Patient reports today's ED visit is secondary to Increased pain in his right Salivary gland. Patient reports previous history of the same Issue with the left salivary gland.Patient states when he tried to schedule his ENT appointment he was told he would have to clear his  Outstanding Debt with the office.Patient reports he does not have a PCP. This CM educated patient to call the 24- hour health Advisor line on his [employer] card.Patient reports he  Will do this.Education provided on importance of establishing a PCP.Reminded patient that my Social work Art therapist had seen him yesterday and that we don't have resources to help With private pay Issues.Patient reports he understands his education today and plans on calling the office back and making a PCP follow up appointment.No further CM needs.

## 2013-08-31 NOTE — ED Notes (Signed)
Pt has had left salivary gland surgery in past and now same symptoms to right side-- problems swallowing and states that he tried to get into [facility] office but they would not see him due to outstanding debt

## 2013-08-31 NOTE — ED Provider Notes (Signed)
CSN: [id number]     Arrival date & time 08/31/13  1218 History   First MD Initiated Contact with Patient 08/31/13 1229    This chart was scribed for Trixie DredgeEmily Alejah Aristizabal PA-C, a non-physician practitioner working with Toy BakerAnthony T Allen, MD by Lewanda RifeAlexandra Hurtado, ED Scribe. This patient was seen in room TR09C/TR09C and the patient's care was started at 12:49 PM     Chief Complaint  Patient presents with  . Sore Throat   (Consider location/radiation/quality/duration/timing/severity/associated sxs/prior Treatment) The history is provided by the patient and medical records. No language interpreter was used.   HPI Comments: Walter Benitez is a 23 y.o. male who presents to the Emergency Department with PMHx of left sided salivary surgery with Dr. Lazarus [facility]ines[facility] 1.5 years ago complaining of constant moderate sore throat and swelling to right side of his neck onset 3 days ago. Reports associated pain with swallowing, but denies dysphagia. Reports associated baseline migraines. Reports trying ibuprofen with mild relief of symptoms. Reports pain is exacerbated with "thinking about food". Denies associated fevers, chills, cough, taking new medications, and rhinorrhea.   Past Medical History  Diagnosis Date  . Migraine   . Unspecified disease of the salivary glands    Past Surgical History  Procedure Laterality Date  . Throat surgery     No family history on file. History  Substance Use Topics  . Smoking status: Current Every Day Smoker    Types: Cigarettes  . Smokeless tobacco: Not on file  . Alcohol Use: No    Review of Systems  Constitutional: Negative for fever.  HENT: Positive for sore throat. Negative for dental problem.   Neurological: Positive for headaches.    Allergies  Review of patient's allergies indicates no known allergies.  Home Medications   Current Outpatient Rx  Name  Route  Sig  Dispense  Refill  . ibuprofen (ADVIL,MOTRIN) 200 MG tablet   Oral   Take 800 mg by mouth every 6  (six) hours as needed for fever, headache or mild pain. Pain          BP 129/104  Pulse 93  Temp(Src) 97.7 F (36.5 C) (Oral)  Resp 16  Ht 6\' 2"  (1.88 m)  Wt 187 lb 6 oz (84.993 kg)  BMI 24.05 kg/m2  SpO2 100% Physical Exam  Nursing note and vitals reviewed. Constitutional: He appears well-developed and well-nourished. No distress.  HENT:  Head: Normocephalic and atraumatic.  Mouth/Throat: Uvula is midline and oropharynx is clear and moist. No trismus in the jaw. No oropharyngeal exudate.  Neck: Normal range of motion. Neck supple.  TTP Right submandibular swelling. No  overlying erythema or warmth   Cardiovascular: Normal rate and regular rhythm.   Pulmonary/Chest: Effort normal and breath sounds normal. No respiratory distress. He has no wheezes. He has no rales.  Abdominal: Soft. He exhibits no distension and no mass. There is no tenderness. There is no rebound and no guarding.  Neurological: He is alert. He exhibits normal muscle tone.  Skin: He is not diaphoretic.    ED Course  Procedures  COORDINATION OF CARE:  Nursing notes reviewed. Vital signs reviewed. Initial pt interview and examination performed.    Treatment plan initiated:Medications - No data to display   Initial diagnostic testing ordered.    Labs Review Labs Reviewed - No data to display Imaging Review No results found.  EKG Interpretation   None       MDM   1. Sialolithiasis of submandibular gland  Pt with suspected sialolithiasis with no obvious signs of infection.  Given sudden increase in pain, will cover with antibiotic.  He is afebrile, nontoxic.  Pt has had two ED visits in two days and has been unable to f/u with ENT due to being refused from [facility] office for having a balance on his account.  Dr Emeline Darling is on call today and is not in the same practice. Pt d/c with norco, ibuprofen, instructions for lemon candies and warm moist compresses, ENT follow up.  Discussed findings,  treatment, and follow up  with patient.  Pt given return precautions.  Pt verbalizes understanding and agrees with plan.       I personally performed the services described in this documentation, which was scribed in my presence. The recorded information has been reviewed and is accurate.    Trixie Dredge, PA-C 08/31/13 1327

## 2013-08-31 NOTE — Discharge Instructions (Signed)
Read the information below.  Use the prescribed medication as directed.  Please discuss all new medications with your pharmacist.  Do not take additional tylenol while taking the prescribed pain medication to avoid overdose.  You may return to the Emergency Department at any time for worsening condition or any new symptoms that concern you.  If you develop high fevers, difficulty swallowing or breathing, or you are unable to tolerate fluids by mouth, return to the ER immediately for a recheck.     Salivary Stone Your exam shows you have a stone in one of your saliva glands. These small stones form around a mucous plug in the ducts of the glands and cause the saliva in the gland to be blocked. This makes the gland swollen and painful, especially when you eat. If repeated episodes occur, the gland can become infected. Sometimes these stones can be seen on x-ray. Treatment includes stimulating the production of saliva to push the stone out. You should suck on a lemon or sour candies several times daily. Antibiotic medicine may be needed if the gland is infected. Increasing fluids, applying warm compresses to the swollen area 3-4 times daily, and massaging the gland from back to front may encourage drainage and passage of the stone. Surgical treatment to remove the stone is sometimes necessary, so proper medical follow up is very important. Call your doctor for an appointment as recommended. Call right away if you have a high fever, severe headache, vomiting, uncontrolled pain, or other serious symptoms. Document Released: 09/01/2004 Document Revised: 10/17/2011 Document Reviewed: 07/25/2005 [facility] Patient Information 2014 [place], [place].

## 2013-09-06 NOTE — ED Provider Notes (Signed)
Medical screening examination/treatment/procedure(s) were performed by non-physician practitioner and as supervising physician I was immediately available for consultation/collaboration.  Toy BakerAnthony T Abundio Teuscher, MD 09/06/13 0930

## 2013-11-25 ENCOUNTER — Encounter (HOSPITAL_COMMUNITY): Payer: Self-pay | Admitting: Emergency Medicine

## 2013-11-25 ENCOUNTER — Emergency Department (HOSPITAL_COMMUNITY): Payer: BC Managed Care – PPO

## 2013-11-25 ENCOUNTER — Emergency Department (HOSPITAL_COMMUNITY)
Admission: EM | Admit: 2013-11-25 | Discharge: 2013-11-25 | Disposition: A | Discharge status: Home / Self Care | Payer: BC Managed Care – PPO | Attending: Emergency Medicine | Admitting: Emergency Medicine

## 2013-11-25 DIAGNOSIS — Z8679 Personal history of other diseases of the circulatory system: Secondary | ICD-10-CM | POA: Insufficient documentation

## 2013-11-25 DIAGNOSIS — Z8719 Personal history of other diseases of the digestive system: Secondary | ICD-10-CM | POA: Insufficient documentation

## 2013-11-25 DIAGNOSIS — F17200 Nicotine dependence, unspecified, uncomplicated: Secondary | ICD-10-CM | POA: Insufficient documentation

## 2013-11-25 DIAGNOSIS — Z792 Long term (current) use of antibiotics: Secondary | ICD-10-CM | POA: Insufficient documentation

## 2013-11-25 DIAGNOSIS — K089 Disorder of teeth and supporting structures, unspecified: Secondary | ICD-10-CM | POA: Insufficient documentation

## 2013-11-25 DIAGNOSIS — R6884 Jaw pain: Secondary | ICD-10-CM | POA: Insufficient documentation

## 2013-11-25 MED ORDER — IBUPROFEN 800 MG PO TABS: 800.0000 mg | ORAL_TABLET | Freq: Three times a day (TID) | ORAL | Status: DC

## 2013-11-25 NOTE — Discharge Instructions (Signed)
Parotitis °Parotitis is soreness and swelling (inflammation) of one or both parotid glands. The parotid glands produce saliva. They are located on each side of the face, below and in front of the earlobes. The saliva produced comes out of tiny openings (ducts) inside the cheeks. In most cases, parotitis goes away over time or with treatment. If your parotitis is caused by certain long-term (chronic) diseases, it may come back again.  °CAUSES  °Parotitis can be caused by: °· Viral infections. Mumps is one viral infection that can cause parotitis. °· Bacterial infections. °· Blockage of the salivary ducts due to a salivary stone. °· Narrowing of the salivary ducts. °· Swelling of the salivary ducts. °· Dehydration. °· Autoimmune conditions, such as sarcoidosis or Sjogren's syndrome. °· Air from activities such as scuba diving, glass blowing, or playing an instrument (rare). °· Human immunodeficiency virus (HIV) or acquired immunodeficiency syndrome (AIDS). °· Tuberculosis. °SYMPTOMS  °· The ears may appear to be pushed up and out from their normal position. °· Redness (erythema) of the skin over the parotid glands. °· Pain and tenderness over the parotid glands. °· Swelling in the parotid gland area. °· Yellowish-white fluid (pus) coming from the ducts inside the cheeks. °· Dry mouth. °· Bad taste in the mouth. °DIAGNOSIS  °Your caregiver may determine that you have parotitis based on your symptoms and a physical exam. A sample of fluid may also be taken from the parotid gland and tested to find the cause of your infection. X-rays or computed tomography (CT) scans may be taken if your caregiver thinks you might have a salivary stone blocking your salivary duct. °TREATMENT  °Treatment varies depending upon the cause of your parotitis. If your parotitis is caused by mumps, no treatment is needed. The condition will go away on its own after 7 to 10 days. In other cases, treatment may include: °· Antibiotics if your  infection was caused by bacteria. °· Pain medicines. °· Gland massage. °· Eating sour candy to increase your saliva production. °· Removal of salivary stones. Your caregiver may flush stones out with fluids or remove them with tweezers. °· Surgery to remove the parotid glands. °HOME CARE INSTRUCTIONS  °· If you were given antibiotics, take them as directed. Finish them even if you start to feel better. °· Put warm compresses on the sore area. °· Only take over-the-counter or prescription medicines for pain, discomfort, or fever as directed by your caregiver. °· Drink enough fluids to keep your urine clear or pale yellow. °SEEK IMMEDIATE MEDICAL CARE IF:  °· You have increasing pain or swelling that is not controlled with medicine. °· You have a fever. °MAKE SURE YOU: °· Understand these instructions. °· Will watch your condition. °· Will get help right away if you are not doing well or get worse. °Document Released: 01/14/2002 Document Revised: 10/17/2011 Document Reviewed: 06/20/2011 °ExitCare® Patient Information ©2014 [employer]. ° °

## 2013-11-25 NOTE — ED Notes (Signed)
Pt with history of salivary gland problems, states his left jaw has been hurting for a couple of weeks now, states he is unable to see the ent doctor due to a bill owed.

## 2013-11-25 NOTE — ED Provider Notes (Signed)
CSN: [id number]     Arrival date & time 11/25/13  0020 History  This chart was scribed for Sunnie NielsenBrian Lakeith Careaga, MD by Danella Maiersaroline Early, ED Scribe. This patient was seen in room APA04/APA04 and the patient's care was started at 12:32 AM.    No chief complaint on file.  Patient is a 23 y.o. male presenting with tooth pain. The history is provided by the patient. No language interpreter was used.  Dental Pain Severity:  Moderate Onset quality:  Gradual Duration:  1 month Progression:  Unchanged Chronicity:  Recurrent Relieved by:  Nothing Worsened by:  Nothing tried Associated symptoms: no fever    HPI Comments: Walter Benitez is a 23 y.o. male who presents to the Emergency Department complaining of left jaw pain onset one month ago described as tightness. He states the pain is worse in the mornings after sleeping on his jaw. He reports he is limited in how wide he can open his mouth due to pain. He states it hurts to chew but he denies pain when watching food commercials (when salivating). He denies fevers. He denies h/o dental problems. He has no chronic medical problems. He states he tried to see his ENT Dr Lazarus SalinesWol[facility] for swelling of the same jaw area 3 months ago and they would not see him because he still owes money for a previous appointment.    Past Medical History  Diagnosis Date  . Migraine   . Unspecified disease of the salivary glands    Past Surgical History  Procedure Laterality Date  . Throat surgery     No family history on file. History  Substance Use Topics  . Smoking status: Current Every Day Smoker    Types: Cigarettes  . Smokeless tobacco: Not on file  . Alcohol Use: No    Review of Systems  Constitutional: Negative for fever and chills.  HENT: Positive for dental problem.   All other systems reviewed and are negative.     Allergies  Review of patient's allergies indicates no known allergies.  Home Medications   Prior to Admission medications   Medication  Sig Start Date End Date Taking? Authorizing Provider  ibuprofen (ADVIL,MOTRIN) 200 MG tablet Take 800 mg by mouth every 6 (six) hours as needed for fever, headache or mild pain. Pain   Yes Historical Provider, MD  cephALEXin (KEFLEX) 500 MG capsule Take 1 capsule (500 mg total) by mouth 4 (four) times daily. 08/31/13   Trixie DredgeEmily West, PA-C  HYDROcodone-acetaminophen (NORCO/VICODIN) 5-325 MG per tablet Take 1 tablet by mouth every 4 (four) hours as needed. 08/31/13   Trixie DredgeEmily West, PA-C  ibuprofen (ADVIL,MOTRIN) 800 MG tablet Take 1 tablet (800 mg total) by mouth every 8 (eight) hours as needed for mild pain or moderate pain. 08/31/13   Trixie DredgeEmily West, PA-C   BP 144/80  Pulse 75  Temp(Src) 98.1 F (36.7 C) (Oral)  Resp 16  SpO2 100% Physical Exam  Nursing note and vitals reviewed. Constitutional: He is oriented to person, place, and time. He appears well-developed and well-nourished. No distress.  HENT:  Head: Normocephalic and atraumatic.  Localizes pain just inferior to the TMJ on the left. No tenderness over the TMJ no associated swelling or erythema. Mild trismus. No dental tenderness. No submandibular swelling or tenderness.   Eyes: EOM are normal.  Neck: Neck supple. No tracheal deviation present.  Cardiovascular: Normal rate.   Pulmonary/Chest: Effort normal. No respiratory distress.  Musculoskeletal: Normal range of motion.  Neurological: He is alert  and oriented to person, place, and time.  Skin: Skin is warm and dry.  Psychiatric: He has a normal mood and affect. His behavior is normal.    ED Course  Procedures (including critical care time) Medications - No data to display  DIAGNOSTIC STUDIES: Oxygen Saturation is 100% on RA, normal by my interpretation.    COORDINATION OF CARE: 12:38 AM- Discussed treatment plan with pt which includes x-ray and ENT referral. Pt agrees to plan.  Labs Review Labs Reviewed - No data to display  Imaging Review Dg Mandible 1-3 Views  11/25/2013    CLINICAL DATA:  Left upper jaw and TMJ pain for 3 weeks.  No trauma.  EXAM: MANDIBLE - 1-3 VIEW  COMPARISON:  US SOFT TISSUE HEAD/NECK dated 03/02/2012; CT MAXILLOFACIAL W/O CM SINUS dated 11/02/2011  FINDINGS: There is no evidence of fracture or other focal bone lesions. Temporomandibular joints are not well demonstrated.  IMPRESSION: Negative.   Electronically Signed   By: Burman NievesWilliam  Stevens M.D.   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 Local ENT referral provided. Given ongoing symptoms without fevers, swelling, or discharge do not feel antibiotics are indicated at this time.  Patient agrees to all discharge and followup instructions and strict return precautions.  MDM   Diagnoses: Jaw pain  Presents with ongoing jaw pain for weeks, has been treated for dental pain and sialolithiasis in the past. Localizes pain to the left parotid region, has some mild trismus. No tenderness over the TMJ. No dental tenderness. No significant swelling, erythema or increased warmth to touch. Patient requesting x-ray which was obtained and reviewed as above. Plan conservative treatment and close outpatient followup. Vital signs and nurses notes reviewed and considered.   I personally performed the services described in this documentation, which was scribed in my presence. The recorded information has been reviewed and is accurate.    Sunnie NielsenBrian Akiva Josey, MD 11/25/13 [phone]

## 2013-11-25 NOTE — ED Notes (Signed)
Pt alert & oriented x4, stable gait. Patient given discharge instructions, paperwork & prescription(s). Patient  instructed to stop at the registration desk to finish any additional paperwork. Patient verbalized understanding. Pt left department w/ no further questions. 

## 2013-12-02 ENCOUNTER — Encounter (HOSPITAL_COMMUNITY): Payer: Self-pay | Admitting: Emergency Medicine

## 2013-12-02 DIAGNOSIS — G8929 Other chronic pain: Secondary | ICD-10-CM | POA: Insufficient documentation

## 2013-12-02 DIAGNOSIS — F17200 Nicotine dependence, unspecified, uncomplicated: Secondary | ICD-10-CM | POA: Insufficient documentation

## 2013-12-02 NOTE — ED Notes (Signed)
Patient presents stating that he has some surgery on his salivary glands on the left side about 1 year ago,  Has been having pain to the left side of his jaw and it travels up into his ear and has been noticing more swelling to the left side

## 2013-12-03 ENCOUNTER — Emergency Department (HOSPITAL_COMMUNITY)
Admission: EM | Admit: 2013-12-03 | Discharge: 2013-12-03 | Disposition: A | Discharge status: Home / Self Care | Payer: BC Managed Care – PPO | Attending: Emergency Medicine | Admitting: Emergency Medicine

## 2013-12-03 DIAGNOSIS — G8929 Other chronic pain: Secondary | ICD-10-CM

## 2013-12-03 DIAGNOSIS — R6884 Jaw pain: Secondary | ICD-10-CM

## 2013-12-03 MED ORDER — TRAMADOL HCL 50 MG PO TABS: 50.0000 mg | ORAL_TABLET | Freq: Four times a day (QID) | ORAL | Status: DC | PRN

## 2013-12-03 MED ORDER — KETOROLAC TROMETHAMINE 60 MG/2ML IM SOLN
60.0000 mg | Freq: Once | INTRAMUSCULAR | Status: AC
  Administered 2013-12-03: 60 mg via INTRAMUSCULAR
  Filled 2013-12-03: qty 2

## 2013-12-03 MED ORDER — HYDROCODONE-ACETAMINOPHEN 5-325 MG PO TABS
1.0000 | ORAL_TABLET | Freq: Once | ORAL | Status: AC
  Administered 2013-12-03: 1 via ORAL
  Filled 2013-12-03: qty 1

## 2013-12-03 MED ORDER — IBUPROFEN 600 MG PO TABS: 600.0000 mg | ORAL_TABLET | Freq: Four times a day (QID) | ORAL | Status: DC | PRN

## 2013-12-03 NOTE — ED Notes (Signed)
Patient states that it has been getting worse in the last week.

## 2013-12-03 NOTE — Discharge Instructions (Signed)
Call and make an appointment to followup with the ear nose and throat MD

## 2013-12-03 NOTE — ED Provider Notes (Signed)
CSN: [id number]     Arrival date & time 12/02/13  2331 History   First MD Initiated Contact with Patient 12/03/13 0158     Chief Complaint  Patient presents with  . Jaw Pain     (Consider location/radiation/quality/duration/timing/severity/associated sxs/prior Treatment) HPI Patient's been seen in the emergency department multiple times for jaw and neck pain. He states he's had ongoing left-sided jaw pain for about the past month. He was seen in the emergency department at any pain roughly one week ago for the same. He has seen Dr. Lazarus SalinesWolicki in the past but states that he is unable to see him do to unpaid debts. He's had no fevers or chills. The pain is worse with opening the mouth. He denies any dental pain or masses. He's had mild trismus space on the left. When he was seen last week he had x-rays performed which were negative. The physician at the time upset the pain may be due to a chronic parotiditis and he was treated conservatively. He was given local ENT followup in [place]. The patient has yet to followup. He is requesting pain medication at this time. Past Medical History  Diagnosis Date  . Migraine   . Unspecified disease of the salivary glands    Past Surgical History  Procedure Laterality Date  . Throat surgery     History reviewed. No pertinent family history. History  Substance Use Topics  . Smoking status: Current Every Day Smoker    Types: Cigarettes  . Smokeless tobacco: Not on file  . Alcohol Use: No    Review of Systems  Constitutional: Negative for fever and chills.  HENT: Negative for facial swelling and sore throat.   Respiratory: Negative for shortness of breath.   Cardiovascular: Negative for chest pain.  Gastrointestinal: Negative for nausea, vomiting, abdominal pain and diarrhea.  Musculoskeletal: Negative for back pain, myalgias, neck pain and neck stiffness.  Neurological: Negative for dizziness, weakness and numbness.  All other systems reviewed  and are negative.     Allergies  Review of patient's allergies indicates no known allergies.  Home Medications   Prior to Admission medications   Medication Sig Start Date End Date Taking? Authorizing Provider  acetaminophen (TYLENOL) 325 MG tablet Take 650 mg by mouth every 6 (six) hours as needed for mild pain.   Yes Historical Provider, MD  ibuprofen (ADVIL,MOTRIN) 800 MG tablet Take 1 tablet (800 mg total) by mouth 3 (three) times daily. 11/25/13  Yes Sunnie NielsenBrian Opitz, MD   BP 130/76  Pulse 77  Temp(Src) 98.1 F (36.7 C) (Oral)  Resp 20  Ht 6\' 1"  (1.854 m)  Wt 180 lb (81.647 kg)  BMI 23.75 kg/m2  SpO2 100% Physical Exam  Nursing note and vitals reviewed. Constitutional: He is oriented to person, place, and time. He appears well-developed and well-nourished. No distress.  HENT:  Head: Normocephalic and atraumatic.  Mouth/Throat: Oropharynx is clear and moist.  Questionable trismus. No dental tenderness to percussion. No dental abscesses apparent. Patient has tenderness to palpation over the TMJ and just inferior to the TMJ joint. There is no warmth or swelling. He has no lymphadenopathy.  Eyes: EOM are normal. Pupils are equal, round, and reactive to light.  Neck: Normal range of motion. Neck supple.  Cardiovascular: Normal rate and regular rhythm.   Pulmonary/Chest: Effort normal and breath sounds normal. No respiratory distress. He has no wheezes. He has no rales.  Abdominal: Soft. Bowel sounds are normal. He exhibits no distension and no  mass. There is no tenderness. There is no rebound and no guarding.  Musculoskeletal: Normal range of motion. He exhibits no edema and no tenderness.  Neurological: He is alert and oriented to person, place, and time.  Skin: Skin is warm and dry. No rash noted. No erythema.  Psychiatric: He has a normal mood and affect. His behavior is normal.    ED Course  Procedures (including critical care time) Labs Review Labs Reviewed - No data to  display  Imaging Review No results found.   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 I stressed to the patient the importance of following up with a ENT.    Loren Raceravid Kelwin Gibler, MD 12/03/13 [phone]

## 2014-02-27 ENCOUNTER — Encounter (HOSPITAL_COMMUNITY): Payer: Self-pay | Admitting: Emergency Medicine

## 2014-02-27 ENCOUNTER — Emergency Department (HOSPITAL_COMMUNITY)
Admission: EM | Admit: 2014-02-27 | Discharge: 2014-02-27 | Disposition: A | Discharge status: Home / Self Care | Payer: BC Managed Care – PPO | Attending: Emergency Medicine | Admitting: Emergency Medicine

## 2014-02-27 ENCOUNTER — Emergency Department (HOSPITAL_COMMUNITY): Payer: BC Managed Care – PPO

## 2014-02-27 DIAGNOSIS — Z791 Long term (current) use of non-steroidal anti-inflammatories (NSAID): Secondary | ICD-10-CM | POA: Insufficient documentation

## 2014-02-27 DIAGNOSIS — F17200 Nicotine dependence, unspecified, uncomplicated: Secondary | ICD-10-CM | POA: Insufficient documentation

## 2014-02-27 DIAGNOSIS — Z8719 Personal history of other diseases of the digestive system: Secondary | ICD-10-CM | POA: Insufficient documentation

## 2014-02-27 DIAGNOSIS — Z8679 Personal history of other diseases of the circulatory system: Secondary | ICD-10-CM | POA: Insufficient documentation

## 2014-02-27 DIAGNOSIS — J40 Bronchitis, not specified as acute or chronic: Secondary | ICD-10-CM

## 2014-02-27 DIAGNOSIS — R110 Nausea: Secondary | ICD-10-CM | POA: Insufficient documentation

## 2014-02-27 DIAGNOSIS — J209 Acute bronchitis, unspecified: Secondary | ICD-10-CM | POA: Insufficient documentation

## 2014-02-27 LAB — CBC WITH DIFFERENTIAL/PLATELET
BASOS ABS: 0 10*3/uL (ref 0.0–0.1)
BASOS PCT: 1 % (ref 0–1)
Eosinophils Absolute: 0.1 10*3/uL (ref 0.0–0.7)
Eosinophils Relative: 3 % (ref 0–5)
HEMATOCRIT: 47.4 % (ref 39.0–52.0)
HEMOGLOBIN: 15.8 g/dL (ref 13.0–17.0)
LYMPHS PCT: 18 % (ref 12–46)
Lymphs Abs: 0.8 10*3/uL (ref 0.7–4.0)
MCH: 28.7 pg (ref 26.0–34.0)
MCHC: 33.3 g/dL (ref 30.0–36.0)
MCV: 86.2 fL (ref 78.0–100.0)
Monocytes Absolute: 0.6 10*3/uL (ref 0.1–1.0)
Monocytes Relative: 14 % — ABNORMAL HIGH (ref 3–12)
NEUTROS ABS: 2.8 10*3/uL (ref 1.7–7.7)
NEUTROS PCT: 64 % (ref 43–77)
Platelets: 168 10*3/uL (ref 150–400)
RBC: 5.5 MIL/uL (ref 4.22–5.81)
RDW: 13.1 % (ref 11.5–15.5)
WBC: 4.4 10*3/uL (ref 4.0–10.5)

## 2014-02-27 LAB — BASIC METABOLIC PANEL
ANION GAP: 11 (ref 5–15)
BUN: 7 mg/dL (ref 6–23)
CHLORIDE: 102 meq/L (ref 96–112)
CO2: 28 meq/L (ref 19–32)
Calcium: 9.5 mg/dL (ref 8.4–10.5)
Creatinine, Ser: 0.69 mg/dL (ref 0.50–1.35)
GFR calc non Af Amer: >90 mL/min (ref >90)
Glucose, Bld: 102 mg/dL — ABNORMAL HIGH (ref 70–99)
POTASSIUM: 3.9 meq/L (ref 3.7–5.3)
Sodium: 141 mEq/L (ref 137–147)

## 2014-02-27 LAB — URINALYSIS, ROUTINE W REFLEX MICROSCOPIC
Bilirubin Urine: NEGATIVE
GLUCOSE, UA: NEGATIVE mg/dL
Hgb urine dipstick: NEGATIVE
KETONES UR: NEGATIVE mg/dL
LEUKOCYTES UA: NEGATIVE
NITRITE: NEGATIVE
PH: 6 (ref 5.0–8.0)
Protein, ur: NEGATIVE mg/dL
Specific Gravity, Urine: 1.01 (ref 1.005–1.030)
Urobilinogen, UA: 0.2 mg/dL (ref 0.0–1.0)

## 2014-02-27 MED ORDER — PHENYLEPH-PROMETHAZINE-COD 5-6.25-10 MG/5ML PO SYRP: 5.0000 mL | ORAL_SOLUTION | Freq: Three times a day (TID) | ORAL | Status: DC | PRN

## 2014-02-27 MED ORDER — AZITHROMYCIN 250 MG PO TABS: ORAL_TABLET | ORAL | Status: DC

## 2014-02-27 NOTE — ED Notes (Addendum)
Pt reports intermittent fever, runny nose, congestion, generalized body aches x5 days. Pt reports taking otc cough medication with no relief. Pt reports phlegm is clear. nad noted.

## 2014-02-27 NOTE — ED Provider Notes (Signed)
CSN: [id number]     Arrival date & time 02/27/14  1138 History   First MD Initiated Contact with Patient 02/27/14 1146     Chief Complaint  Patient presents with  . Cough     (Consider location/radiation/quality/duration/timing/severity/associated sxs/prior Treatment) Patient is a 23 y.o. male presenting with cough. The history is provided by the patient.  Cough Cough characteristics:  Productive Onset quality:  Gradual Duration:  5 days Timing:  Intermittent Progression:  Worsening Chronicity:  New Smoker: yes   Relieved by:  Nothing Worsened by:  Activity, lying down and smoking Ineffective treatments:  Cough suppressants Associated symptoms: fever    Sol PasserChristopher C Kerschner is a 23 y.o. male who presents to the ED with cough, congestion, fever and chills. He states it started with a scratchy throat and then post nasal drainage followed by cough and fever. He has had decreased appetite but has tried to drink plenty of fluids. He has had some nausea and diarrhea as well. He is having 4 loose stools per day. His cough has been production with yellow drainage with some streaks of blood. He states he feels sore all over. He has decreased urination and afraid he may be getting dehydrated. He is an every day smoker.   Past Medical History  Diagnosis Date  . Migraine   . Unspecified disease of the salivary glands    Past Surgical History  Procedure Laterality Date  . Throat surgery     History reviewed. No pertinent family history. History  Substance Use Topics  . Smoking status: Current Every Day Smoker -- 0.50 packs/day    Types: Cigarettes  . Smokeless tobacco: Not on file  . Alcohol Use: No    Review of Systems  Constitutional: Positive for fever.  HENT: Positive for congestion and sinus pressure. Negative for trouble swallowing.   Respiratory: Positive for cough.   Gastrointestinal: Positive for nausea. Negative for abdominal pain.  all other systems  negative.    Allergies  Review of patient's allergies indicates no known allergies.  Home Medications   Prior to Admission medications   Medication Sig Start Date End Date Taking? Authorizing Provider  Dextromethorphan-Guaifenesin 5-100 MG/5ML LIQD Take 10 mLs by mouth every 4 (four) hours as needed (chest congestion/ cough).   Yes Historical Provider, MD  ibuprofen (ADVIL,MOTRIN) 200 MG tablet Take 800 mg by mouth every 6 (six) hours as needed for mild pain.   Yes Historical Provider, MD  azithromycin (ZITHROMAX Z-PAK) 250 MG tablet Take 2 tablets PO today and then one tablet daily until finished for infection 02/27/14   Janne NapoleonHope M Neese, NP  Phenyleph-Promethazine-Cod 5-6.25-10 MG/5ML SYRP Take 5 mLs by mouth every 8 (eight) hours as needed. 02/27/14   Hope Orlene OchM Neese, NP   BP 134/78  Pulse 70  Temp(Src) 98.2 F (36.8 C) (Oral)  Resp 18  Ht 6\' 2"  (1.88 m)  Wt 185 lb (83.915 kg)  BMI 23.74 kg/m2  SpO2 95% Physical Exam  Nursing note and vitals reviewed. Constitutional: He is oriented to person, place, and time. He appears well-developed and well-nourished.  HENT:  Head: Normocephalic and atraumatic.  Right Ear: Tympanic membrane normal.  Left Ear: Tympanic membrane normal.  Nose: Mucosal edema and rhinorrhea present. No epistaxis. Right sinus exhibits maxillary sinus tenderness. Left sinus exhibits maxillary sinus tenderness.  Mouth/Throat: Uvula is midline, oropharynx is clear and moist and mucous membranes are normal.  Eyes: Conjunctivae and EOM are normal. Pupils are equal, round, and reactive to light.  Neck: Neck supple.  Cardiovascular: Normal rate and regular rhythm.   Pulmonary/Chest: Effort normal. He has no wheezes. He has no rales.  Abdominal: Soft. There is no tenderness.  Musculoskeletal: Normal range of motion.  Lymphadenopathy:    He has no cervical adenopathy.  Neurological: He is alert and oriented to person, place, and time. No cranial nerve deficit.  Skin: Skin  is warm and dry.  Psychiatric: He has a normal mood and affect. His behavior is normal.    ED Course  Procedures (including critical care time) Labs Review Results for orders placed during the hospital encounter of 02/27/14 (from the past 24 hour(s))  CBC WITH DIFFERENTIAL     Status: Abnormal   Collection Time    02/27/14 12:06 PM      Result Value Ref Range   WBC 4.4  4.0 - 10.5 K/uL   RBC 5.50  4.22 - 5.81 MIL/uL   Hemoglobin 15.8  13.0 - 17.0 g/dL   HCT 16.1  09.6 - 04.5 %   MCV 86.2  78.0 - 100.0 fL   MCH 28.7  26.0 - 34.0 pg   MCHC 33.3  30.0 - 36.0 g/dL   RDW 40.9  81.1 - 91.4 %   Platelets 168  150 - 400 K/uL   Neutrophils Relative % 64  43 - 77 %   Neutro Abs 2.8  1.7 - 7.7 K/uL   Lymphocytes Relative 18  12 - 46 %   Lymphs Abs 0.8  0.7 - 4.0 K/uL   Monocytes Relative 14 (*) 3 - 12 %   Monocytes Absolute 0.6  0.1 - 1.0 K/uL   Eosinophils Relative 3  0 - 5 %   Eosinophils Absolute 0.1  0.0 - 0.7 K/uL   Basophils Relative 1  0 - 1 %   Basophils Absolute 0.0  0.0 - 0.1 K/uL  BASIC METABOLIC PANEL     Status: Abnormal   Collection Time    02/27/14 12:06 PM      Result Value Ref Range   Sodium 141  137 - 147 mEq/L   Potassium 3.9  3.7 - 5.3 mEq/L   Chloride 102  96 - 112 mEq/L   CO2 28  19 - 32 mEq/L   Glucose, Bld 102 (*) 70 - 99 mg/dL   BUN 7  6 - 23 mg/dL   Creatinine, Ser 7.82  0.50 - 1.35 mg/dL   Calcium 9.5  8.4 - 95.6 mg/dL   GFR calc non Af Amer >90  >90 mL/min   GFR calc Af Amer >90  >90 mL/min   Anion gap 11  5 - 15  URINALYSIS, ROUTINE W REFLEX MICROSCOPIC     Status: None   Collection Time    02/27/14 12:52 PM      Result Value Ref Range   Color, Urine YELLOW  YELLOW   APPearance CLEAR  CLEAR   Specific Gravity, Urine 1.010  1.005 - 1.030   pH 6.0  5.0 - 8.0   Glucose, UA NEGATIVE  NEGATIVE mg/dL   Hgb urine dipstick NEGATIVE  NEGATIVE   Bilirubin Urine NEGATIVE  NEGATIVE   Ketones, ur NEGATIVE  NEGATIVE mg/dL   Protein, ur NEGATIVE  NEGATIVE  mg/dL   Urobilinogen, UA 0.2  0.0 - 1.0 mg/dL   Nitrite NEGATIVE  NEGATIVE   Leukocytes, UA NEGATIVE  NEGATIVE    Dg Chest 2 View  02/27/2014   CLINICAL DATA:  Cough, fever  EXAM: CHEST  2 VIEW  COMPARISON:  Prior radiograph from 08/02/2005.  FINDINGS: The cardiac and mediastinal silhouettes are stable in size and contour, and remain within normal limits.  The lungs are normally inflated. No airspace consolidation, pleural effusion, or pulmonary edema is identified. There is no pneumothorax.  No acute osseous abnormality identified.  IMPRESSION: No active cardiopulmonary disease.   Electronically Signed   By: Rise Mu M.D.   On: 02/27/2014 12:31    MDM  23 y.o. every day smoker with productive cough and congestion x 5 days and hx of fever. Will treat for bronchitis and encourage patient to stop smoking. Stable for discharge without fever, or shortness of breath. Normal chest x-ray and labs. He does not appear dehydrated. He will continue to try and drink plenty of fluids and return if symptoms worsen.    Medication List    TAKE these medications       azithromycin 250 MG tablet  Commonly known as:  ZITHROMAX Z-PAK  Take 2 tablets PO today and then one tablet daily until finished for infection     Phenyleph-Promethazine-Cod 5-6.25-10 MG/5ML Syrp  Take 5 mLs by mouth every 8 (eight) hours as needed.      ASK your doctor about these medications       Dextromethorphan-Guaifenesin 5-100 MG/5ML Liqd  Take 10 mLs by mouth every 4 (four) hours as needed (chest congestion/ cough).     ibuprofen 200 MG tablet  Commonly known as:  ADVIL,MOTRIN  Take 800 mg by mouth every 6 (six) hours as needed for mild pain.           [facility] Orlene Och, [place] 02/27/14 1531

## 2014-02-28 NOTE — ED Provider Notes (Signed)
  Medical screening examination/treatment/procedure(s) were performed by non-physician practitioner and as supervising physician I was immediately available for consultation/collaboration.     Gerhard Munchobert Lavina Resor, MD 02/28/14 [phone]

## 2014-08-27 ENCOUNTER — Encounter (HOSPITAL_COMMUNITY): Payer: Self-pay | Admitting: Emergency Medicine

## 2014-08-27 ENCOUNTER — Emergency Department (HOSPITAL_COMMUNITY)
Admission: EM | Admit: 2014-08-27 | Discharge: 2014-08-28 | Disposition: A | Discharge status: Home / Self Care | Payer: BLUE CROSS/BLUE SHIELD | Attending: Emergency Medicine | Admitting: Emergency Medicine

## 2014-08-27 DIAGNOSIS — Z8679 Personal history of other diseases of the circulatory system: Secondary | ICD-10-CM | POA: Insufficient documentation

## 2014-08-27 DIAGNOSIS — Z720 Tobacco use: Secondary | ICD-10-CM | POA: Diagnosis not present

## 2014-08-27 DIAGNOSIS — Z792 Long term (current) use of antibiotics: Secondary | ICD-10-CM | POA: Insufficient documentation

## 2014-08-27 DIAGNOSIS — J029 Acute pharyngitis, unspecified: Secondary | ICD-10-CM | POA: Diagnosis present

## 2014-08-27 DIAGNOSIS — K1120 Sialoadenitis, unspecified: Secondary | ICD-10-CM | POA: Diagnosis not present

## 2014-08-27 NOTE — ED Notes (Signed)
Pt. reports sore throat/ swelling onset this week , denies fever or chills, airway intact/ respirations unlabored .

## 2014-08-28 LAB — RAPID STREP SCREEN (MED CTR MEBANE ONLY): STREPTOCOCCUS, GROUP A SCREEN (DIRECT): NEGATIVE

## 2014-08-28 MED ORDER — AMOXICILLIN-POT CLAVULANATE 875-125 MG PO TABS
1.0000 | ORAL_TABLET | Freq: Two times a day (BID) | ORAL | Status: DC
Start: 2014-08-28 — End: 2015-09-07

## 2014-08-28 MED ORDER — TRAMADOL HCL 50 MG PO TABS: 50.0000 mg | ORAL_TABLET | Freq: Four times a day (QID) | ORAL | Status: DC | PRN

## 2014-08-28 NOTE — ED Provider Notes (Signed)
CSN: [id number]     Arrival date & time 08/27/14  2343 History   First MD Initiated Contact with Patient 08/27/14 2346     Chief Complaint  Patient presents with  . Sore Throat   HPI  Patient is a 24 year old male with past medical history of sialadenitis who presents emergency room for evaluation of sore throat. Patient states that for the past week he has been having soreness underneath his jaw especially when swallowing. He feels like he her salivary stone. He has been trying ibuprofen and sucking on sour candies. He does not feel that this is helping. He feels that he needs to go back to see an ear nose and throat doctor but cannot return to Dr. Raye Sorrow office as he has an unpaid bill of air. Patient denies fevers, chills, vomiting, facial swelling, difficulty swallowing, cough, congestion, ear pain, shortness of breath.  Past Medical History  Diagnosis Date  . Migraine   . Unspecified disease of the salivary glands    Past Surgical History  Procedure Laterality Date  . Throat surgery     No family history on file. History  Substance Use Topics  . Smoking status: Current Every Day Smoker -- 0.50 packs/day    Types: Cigarettes  . Smokeless tobacco: Not on file  . Alcohol Use: No    Review of Systems  Constitutional: Negative for fever, chills and fatigue.  HENT: Negative for congestion, drooling, facial swelling, mouth sores, rhinorrhea and sneezing.   Respiratory: Negative for chest tightness and shortness of breath.   Cardiovascular: Negative for chest pain and palpitations.  Gastrointestinal: Positive for nausea. Negative for vomiting.  Hematological: Positive for adenopathy.  All other systems reviewed and are negative.     Allergies  Review of patient's allergies indicates no known allergies.  Home Medications   Prior to Admission medications   Medication Sig Start Date End Date Taking? Authorizing Provider  amoxicillin-clavulanate (AUGMENTIN) 875-125 MG per  tablet Take 1 tablet by mouth 2 (two) times daily. One po bid x 7 days 08/28/14   Magenta Schmiesing A Forcucci, PA-C  azithromycin (ZITHROMAX Z-PAK) 250 MG tablet Take 2 tablets PO today and then one tablet daily until finished for infection 02/27/14   Janne Napoleon, NP  Dextromethorphan-Guaifenesin 5-100 MG/5ML LIQD Take 10 mLs by mouth every 4 (four) hours as needed (chest congestion/ cough).    Historical Provider, MD  ibuprofen (ADVIL,MOTRIN) 200 MG tablet Take 800 mg by mouth every 6 (six) hours as needed for mild pain.    Historical Provider, MD  Phenyleph-Promethazine-Cod 5-6.25-10 MG/5ML SYRP Take 5 mLs by mouth every 8 (eight) hours as needed. 02/27/14   Hope Orlene Och, NP  traMADol (ULTRAM) 50 MG tablet Take 1 tablet (50 mg total) by mouth every 6 (six) hours as needed. 08/28/14   Jill Ruppe A Forcucci, PA-C   BP 155/86 mmHg  Pulse 105  Temp(Src) 98.2 F (36.8 C) (Oral)  Resp 16  SpO2 98% Physical Exam  Constitutional: He is oriented to person, place, and time. He appears well-developed and well-nourished. No distress.  HENT:  Head: Normocephalic and atraumatic.  Mouth/Throat: Uvula is midline, oropharynx is clear and moist and mucous membranes are normal. No trismus in the jaw. No oropharyngeal exudate.  Tenderness palpation over the left sublingual salivary gland with no visible or palpable salivary stone.  Eyes: Conjunctivae and EOM are normal. Pupils are equal, round, and reactive to light. No scleral icterus.  Neck: Normal range of motion. Neck supple.  No JVD present. No thyromegaly present.  Cardiovascular: Normal rate, regular rhythm, normal heart sounds and intact distal pulses.  Exam reveals no gallop and no friction rub.   No murmur heard. Pulmonary/Chest: Effort normal and breath sounds normal. No respiratory distress. He has no wheezes. He has no rales. He exhibits no tenderness.  Musculoskeletal: Normal range of motion.  Lymphadenopathy:    He has no cervical adenopathy.   Neurological: He is alert and oriented to person, place, and time.  Skin: Skin is warm and dry. He is not diaphoretic.  Psychiatric: He has a normal mood and affect. His behavior is normal. Judgment and thought content normal.  Nursing note and vitals reviewed.   ED Course  Procedures (including critical care time) Labs Review Labs Reviewed  RAPID STREP SCREEN  CULTURE, GROUP A STREP    Imaging Review No results found.   EKG Interpretation None      MDM   Final diagnoses:  Sialoadenitis of submandibular gland   Patient is a 24 year old male who presents emergency room for evaluation of pain in his mouth. There is no visible facial swelling. There is no evidence for trismus, drooling, or liquid weeks angina. Given history suspect that this may be possible sialadenitis of the left submandibular salivary gland. We'll treat with Augmentin and Ultram as needed for pain. I have recommended continuing sucking on sour candies. We'll discharge home with referral to an ear nose and throat doctor. Patient to return for signs of Ludwig's angina which we have discussed. He states understanding and agreement with the above plan. Patient is stable for discharge at this time.    Eben Burowourtney A Forcucci, PA-C 08/28/14 0030  Olivia Mackielga M Otter, MD 08/28/14 [phone]

## 2014-08-28 NOTE — Discharge Instructions (Signed)
Salivary Stone Your exam shows you have a stone in one of your saliva glands. These small stones form around a mucous plug in the ducts of the glands and cause the saliva in the gland to be blocked. This makes the gland swollen and painful, especially when you eat. If repeated episodes occur, the gland can become infected. Sometimes these stones can be seen on x-ray. Treatment includes stimulating the production of saliva to push the stone out. You should suck on a lemon or sour candies several times daily. Antibiotic medicine may be needed if the gland is infected. Increasing fluids, applying warm compresses to the swollen area 3-4 times daily, and massaging the gland from back to front may encourage drainage and passage of the stone. Surgical treatment to remove the stone is sometimes necessary, so proper medical follow up is very important. Call your doctor for an appointment as recommended. Call right away if you have a high fever, severe headache, vomiting, uncontrolled pain, or other serious symptoms. Document Released: 09/01/2004 Document Revised: 10/17/2011 Document Reviewed: 07/25/2005 [facility] Patient Information 2015 [place], [place]. This information is not intended to replace advice given to you by your health care provider. Make sure you discuss any questions you have with your health care provider.   Emergency Department Resource Guide 1) Find a Doctor and Pay Out of Pocket Although you won't have to find out who is covered by your insurance plan, it is a good idea to ask around and get recommendations. You will then need to call the office and see if the doctor you have chosen will accept you as a new patient and what types of options they offer for patients who are self-pay. Some doctors offer discounts or will set up payment plans for their patients who do not have insurance, but you will need to ask so you aren't surprised when you get to your appointment.  2) Contact Your Local Health  Department Not all health departments have doctors that can see patients for sick visits, but many do, so it is worth a call to see if yours does. If you don't know where your local health department is, you can check in your phone book. The CDC also has a tool to help you locate your state's health department, and many state websites also have listings of all of their local health departments.  3) Find a Walk-in Clinic If your illness is not likely to be very severe or complicated, you may want to try a walk in clinic. These are popping up all over the country in pharmacies, drugstores, and shopping centers. They're usually staffed by nurse practitioners or physician assistants that have been trained to treat common illnesses and complaints. They're usually fairly quick and inexpensive. However, if you have serious medical issues or chronic medical problems, these are probably not your best option.  No Primary Care Doctor: - Call Health Connect at  [phone] - they can help you locate a primary care doctor that  accepts your insurance, provides certain services, etc. - Physician Referral Service- [phone]  Chronic Pain Problems: Organization         Address  Phone   Notes  [facility]  [phone] Patients need to be referred by their primary care doctor.   Medication Assistance: Organization         Address  Phone   Notes  [facility] Medication [facility] [address] [place], [place] [address] [phone] --Must  be a resident of [place] -- Must have NO insurance coverage whatsoever (no Medicaid/ Medicare, etc.) -- The pt. MUST have a primary care doctor that directs their care regularly and follows them in the community   Med[facility]ist  [phone]   [employer]  [phone]    Agencies that provide inexpensive medical care: Organization         Address  Phone   Notes  [facility]  [phone]) [phone]   [facility]    [phone]) [phone]   [facility] [address] [place], [place] [address] [phone]   [facility] [address], [place] [phone]   Planned Parenthood    [phone]   [facility]    [phone]   [facility]  [address], [place] Phone:  [phone], Fax:  [phone] Hours of Operation:  9 am - 6 pm, M-F.  Also accepts Medicaid/Medicare and self-pay.  [facility]  [address], [place] Phone: [phone]) [phone], Fax: [phone]. Hours of Operation:  8:30 am - 5:30 pm, M-F.  Also accepts Medicaid and self-pay.  [facility] [place] [address], [place] Phone: [phone]) [phone]   [facility] [address], [place], [place] [phone], Ext. [phone] Mondays & Thursdays: 7-9 AM.  First 15 patients are seen on a first come, first serve basis.    Medicaid-accepting [facility] Providers:  Organization         Address  Phone   Notes  [facility] [address], [place] [phone]) [phone] Also accepts self-pay patients.  [facility] [address], [place]  [phone]) [phone]   [facility] [address], [place] [phone]) [phone]   [facility] [address] [place] [address], [place] [phone]) [phone]   Renaye RakersVeita Bland [address], [place]   [phone] Only accepts [place] Access [place] patients after they have their name applied to their card.   Self-Pay (no insurance) in [facility]:  Organization         Address  Phone   Notes  Sickle Cell Patients, [facility] [address], [place] [phone]   [facility] [address], [place] [phone]) [phone]   [facility] [place]  [address],  [place] [phone]   [facility]/Dr. Osei-Bonsu  [address] [place] [address], [place] or [phone], [place] [phone]) [phone] Phone number for both [place] and [place] locations is the same.  [facility] [address], [place] [phone]   [facility] [address] [place] [address], [place] or [address] [phone]   [facility] [address], [place] [phone](336) [phone], phone; [phone], fax Sees patients 1st and 3rd Saturday of every month.  Must not qualify for public or private insurance (i.e. Medicaid, Medicare,  Health Choice, Veterans' Benefits)  Household income should be no more than 200% of the poverty level The clinic cannot treat you if you are pregnant or think you are pregnant  Sexually transmitted diseases are not treated at the clinic.    Dental Care: Organization  Address  Phone  Notes  [facility] [address], [place] [phone]) [phone] Accepts children up to age 22 who are enrolled in [place] or [place] Health Choice; pregnant women with a Medicaid card; and children who have applied for Medicaid or [place] Health Choice, but were declined, whose parents can pay a reduced fee at time of service.  [facility] of [facility] [place]  [address], [place] [phone] Accepts children up to age 22 who are enrolled in [place] or [place] Health Choice; pregnant women with a Medicaid card; and children who have applied for Medicaid or [place] Health Choice, but were declined, whose parents can pay a reduced fee at time of service.  [facility]  [address], [place] [phone] Patients are seen by appointment only. Walk-ins are not accepted. [facility] will see patients 22 years of age and older. Monday - Tuesday (8am-5pm) Most Wednesdays  (8:30-5pm) $30 per visit, cash only  [facility]  [address], [facility] [phone] Patients are seen by appointment only. Walk-ins are not accepted. [facility] will see patients 22 years of age and older. One Wednesday Evening (Monthly: Volunteer Based).  $30 per visit, cash only  [facility]  [phone] for adults; Children under age 434, call Graduate Pediatric Dentistry at [phone]. Children aged 464-14, please call [phone] to request a pediatric application.  Dental services are provided in all areas of dental care including fillings, crowns and bridges, complete and partial dentures, implants, gum treatment, root canals, and extractions. Preventive care is also provided. Treatment is provided to both adults and children. Patients are selected via a lottery and there is often a waiting list.   [facility] [address], [place]  [phone]) [phone] [email or web address]   [facility] [address], [place], [place] [phone], Ext. [phone] Second and Fourth Thursday of each month, opens at 6:30 AM; Clinic ends at 9 AM.  Patients are seen on a first-come first-served basis, and a limited number are seen during each clinic.   [facility]  [address], [place], [place] [phone]) [phone]   Eligibility Requirements You must have lived in [place], [place], or [place] counties for at least the last three months.   You cannot be eligible for state or federal sponsored National Cityhealthcare insurance, including CIGNAVeterans Administration, [place]icaid, or [employer]Medicare.   You generally cannot be eligible for healthcare insurance through your employer.    How to apply: Eligibility screenings are held every Tuesday and Wednesday afternoon from 1:00 pm until 4:00 pm. You do not need an appointment for the interview!  [facility] [address], [place], [place] [phone]   [facility]  Health Department  [phone]   [facility]  [phone]   [facility]  [phone]    Behavioral Health Resources in the Community: Intensive Outpatient Programs Organization         Address  Phone  Notes  [facility] [address], [place], [place] [phone]   [facility] [address], [place], [place] [phone]   [facility] [address], [place], [place]  [phone]   [facility] [address],  [place], [place] [phone] or [phone]   Substance Abuse Resources  Organization         Address  Phone  Notes  Alcohol [facility] Drug Services  [phone]   [facility]  [phone]   [facility] [place]  [phone]   Floydene Flock  [phone]   Residential & Outpatient Substance Abuse Program  [phone]   Psychological Services Organization         Address  Phone  Notes  [facility]  [phone]   [facility]  [phone]   [facility] [address], [place] [phone] or [phone]    Mobile Crisis Teams Organization         Address  Phone  Notes  [facility], Mobile Crisis Care Unit  [phone]   [facility]  [address] [employer], [place] [phone]   Doristine Locks [address] [place] [phone]    Self-Help/Support Groups Organization         Address  Phone             Notes  [facility] of [place] - variety of support groups  [phone]- I7437963 Call for more information  Narcotics Anonymous (NA), [facility] [address], [employer] [place]  2 meetings at this location   Statistician         Address  Phone  Notes  [facility] [address],    [place]  [phone]   [facility]  [address], [place] [address], [place], [place]  [phone]   [facility] [address] [place], [place] [phone] Admissions: 8am-3pm M-F  [facility] [address],    [place], [place] [phone]   [facility] [address] [place], [place], [place] [phone]   [facility] [address],  [place], [place] [phone]   [facility] - Intensive Outpatient [address], [place], [place] [phone]   [facility] [address]  [place], [place] [phone] or [phone]   [facility] ([facility]) [address], [place], [place] [phone] Accepts Medicaid  [facility] [place] [address]  [place] [phone] Substance Abuse/Addiction Treatment   [facility] Organization         Address  Phone  Notes  [facility]  [phone]   Angie Fava, PhD [address] [place], [place]   [phone] or [phone]   [facility]al   [address] [place], [place] [phone]   [facility] [address], [place], [place] [phone] Insurance/Medicaid/sponsorship through [facility] and Families [address]                                    [place], [place] [phone] Therapy/tele-psych/case  [facility] [address], [place] [phone]    Dr. Lolly Mustache  [phone]   [facility]  [employer] [facility] Dept. 1) [address] [place] 2) [address], [place] 3)  [address], [place] [phone]  [phone]   [facility] Child Abuse Hotline 847-232-5133 or [phone] (After Hours)

## 2014-08-30 LAB — CULTURE, GROUP A STREP

## 2015-03-25 ENCOUNTER — Emergency Department (HOSPITAL_COMMUNITY)
Admission: EM | Admit: 2015-03-25 | Discharge: 2015-03-25 | Disposition: A | Discharge status: Home / Self Care | Payer: BLUE CROSS/BLUE SHIELD | Attending: Emergency Medicine | Admitting: Emergency Medicine

## 2015-03-25 ENCOUNTER — Emergency Department (HOSPITAL_COMMUNITY): Payer: BLUE CROSS/BLUE SHIELD

## 2015-03-25 ENCOUNTER — Encounter (HOSPITAL_COMMUNITY): Payer: Self-pay | Admitting: Emergency Medicine

## 2015-03-25 DIAGNOSIS — W270XXA Contact with workbench tool, initial encounter: Secondary | ICD-10-CM | POA: Insufficient documentation

## 2015-03-25 DIAGNOSIS — Y9389 Activity, other specified: Secondary | ICD-10-CM | POA: Insufficient documentation

## 2015-03-25 DIAGNOSIS — Y990 Civilian activity done for income or pay: Secondary | ICD-10-CM | POA: Insufficient documentation

## 2015-03-25 DIAGNOSIS — Z720 Tobacco use: Secondary | ICD-10-CM | POA: Insufficient documentation

## 2015-03-25 DIAGNOSIS — Y9289 Other specified places as the place of occurrence of the external cause: Secondary | ICD-10-CM | POA: Insufficient documentation

## 2015-03-25 DIAGNOSIS — Z8679 Personal history of other diseases of the circulatory system: Secondary | ICD-10-CM | POA: Insufficient documentation

## 2015-03-25 DIAGNOSIS — S6991XA Unspecified injury of right wrist, hand and finger(s), initial encounter: Secondary | ICD-10-CM | POA: Insufficient documentation

## 2015-03-25 NOTE — ED Provider Notes (Signed)
CSN: [id number]     Arrival date & time 03/25/15  2058 History  This chart was scribed for Eyvonne Mechanic, PA-C working with Elwin Mocha, MD by Evon Slack, ED Scribe. This patient was seen in room TR08C/TR08C and the patient's care was started at 9:35 PM.      Chief Complaint  Patient presents with  . Finger Injury   The history is provided by the patient. No language interpreter was used.   HPI Comments: Walter Benitez is a 24 y.o. male who presents to the Emergency Department complaining of right little finger injury onset 2 weeks prior. Pt states that 2 weeks ago he slammed the finger in a work Merchant navy officer and it ripped the nail off. Pt states that he re injured the finger today by hitting the finger with a hammer. Pt reports drainage coming from the the nail bed today when he hit the finger with a hammer. Pt state that he has soaked the finger in iodine and peroxide with no relief. Pt states that he is right hand dominant. Pt states that the pain is worse with movement and palpation. Pt denies numbness or other related symptoms.    Past Medical History  Diagnosis Date  . Migraine   . Unspecified disease of the salivary glands    Past Surgical History  Procedure Laterality Date  . Throat surgery     No family history on file. Social History  Substance Use Topics  . Smoking status: Current Every Day Smoker -- 0.50 packs/day    Types: Cigarettes  . Smokeless tobacco: None  . Alcohol Use: No    Review of Systems  All other systems reviewed and are negative.    Allergies  Review of patient's allergies indicates no known allergies.  Home Medications   Prior to Admission medications   Medication Sig Start Date End Date Taking? Authorizing Provider  amoxicillin-clavulanate (AUGMENTIN) 875-125 MG per tablet Take 1 tablet by mouth 2 (two) times daily. One po bid x 7 days 08/28/14   Terri Piedra, PA-C  azithromycin (ZITHROMAX Z-PAK) 250 MG tablet Take 2 tablets PO today  and then one tablet daily until finished for infection 02/27/14   Janne Napoleon, NP  Dextromethorphan-Guaifenesin 5-100 MG/5ML LIQD Take 10 mLs by mouth every 4 (four) hours as needed (chest congestion/ cough).    Historical Provider, MD  ibuprofen (ADVIL,MOTRIN) 200 MG tablet Take 800 mg by mouth every 6 (six) hours as needed for mild pain.    Historical Provider, MD  Phenyleph-Promethazine-Cod 5-6.25-10 MG/5ML SYRP Take 5 mLs by mouth every 8 (eight) hours as needed. 02/27/14   Hope Orlene Och, NP  traMADol (ULTRAM) 50 MG tablet Take 1 tablet (50 mg total) by mouth every 6 (six) hours as needed. 08/28/14   Courtney Forcucci, PA-C   BP 140/88 mmHg  Pulse 70  Temp(Src) 98.2 F (36.8 C) (Oral)  Resp 18  SpO2 99%   Physical Exam  Constitutional: He is oriented to person, place, and time. He appears well-developed and well-nourished. No distress.  HENT:  Head: Normocephalic and atraumatic.  Eyes: Conjunctivae and EOM are normal.  Neck: Neck supple. No tracheal deviation present.  Pulmonary/Chest: Effort normal. No respiratory distress.  Musculoskeletal: Normal range of motion.  Right left distal phalanx has small amount of swelling. Nail completely removed healing well. Pt has near complete extension of the distal phalanx and near complete flexion. Sensation intact. No signs of infection   Neurological: He is alert and oriented  to person, place, and time.  Skin: Skin is warm and dry.  Psychiatric: He has a normal mood and affect. His behavior is normal.  Nursing note and vitals reviewed.   ED Course  Procedures (including critical care time)  Labs Review Labs Reviewed - No data to display  Imaging Review Dg Finger Little Right  03/25/2015   CLINICAL DATA:  Right little finger injured with a hammer.  EXAM: RIGHT LITTLE FINGER 2+V  COMPARISON:  None.  FINDINGS: There is no evidence of fracture or dislocation. There is no evidence of arthropathy or other focal bone abnormality. Soft tissues  are unremarkable.  IMPRESSION: Negative.   Electronically Signed   By: Elige Ko   On: 03/25/2015 21:58   I have personally reviewed and evaluated these images and lab results as part of my medical decision-making.   EKG Interpretation None      MDM   Final diagnoses:  Finger injury, right, initial encounter    Labs:  Imaging: DG finger right- no significant findings  Consults:  Therapeutics:  Discharge Meds:   Assessment/Plan: Patient presents with injury to the distal finger. Films today show no acute fracture, patient has near full range of motion of the finger, highly unlikely to have tendon involvement. Patient has some swelling to the distal phalanx, no signs of infection. Patient will be instructed to apply triple antibiotic ointment onto the nailbed, keep finger splinted in order to maintain length, and follow-up with hand surgery if symptoms do not improve. Patient's instructed to monitor for signs of infection and return immediately if any present.   I personally performed the services described in this documentation, which was scribed in my presence. The recorded information has been reviewed and is accurate.     Eyvonne Mechanic, PA-C 03/25/15 2229  Elwin Mocha, MD 03/26/15 [phone]

## 2015-03-25 NOTE — ED Notes (Signed)
Pt left at this time with all belongings.  

## 2015-03-25 NOTE — ED Notes (Signed)
Hit R 5th digit with a hammer at work this evening.  Reports he injured same finger 2 weeks ago and nail came off.  States he had pus draining from finger today and has been unable to remove strings of gauze that was in wound from 2 weeks ago.

## 2015-03-25 NOTE — Discharge Instructions (Signed)
Fingertip Injuries and Amputations Fingertip injuries are common and often get injured because they are last to escape when pulling your hand out of harm's way. You have amputated (cut off) part of your finger. How this turns out depends largely on how much was amputated. If just the tip is amputated, often the end of the finger will grow back and the finger may return to much the same as it was before the injury.  If more of the finger is missing, your caregiver has done the best with the tissue remaining to allow you to keep as much finger as is possible. Your caregiver after checking your injury has tried to leave you with a painless fingertip that has durable, feeling skin. If possible, your caregiver has tried to maintain the finger's length and appearance and preserve its fingernail.  Please read the instructions outlined below and refer to this sheet in the next few weeks. These instructions provide you with general information on caring for yourself. Your caregiver may also give you specific instructions. While your treatment has been done according to the most current medical practices available, unavoidable complications occasionally occur. If you have any problems or questions after discharge, please call your caregiver. HOME CARE INSTRUCTIONS   You may resume normal diet and activities as directed or allowed.  Keep your hand elevated above the level of your heart. This helps decrease pain and swelling.  Keep ice packs (or a bag of ice wrapped in a towel) on the injured area for 15-20 minutes, 03-04 times per day, for the first two days.  Change dressings if necessary or as directed.  Clean the wound daily or as directed.  Only take over-the-counter or prescription medicines for pain, discomfort, or fever as directed by your caregiver.  Keep appointments as directed. SEEK IMMEDIATE MEDICAL CARE IF:  You develop redness, swelling, numbness or increasing pain in the wound.  There is  pus coming from the wound.  You develop an unexplained oral temperature above 102 F (38.9 C) or as your caregiver suggests.  There is a foul (bad) smell coming from the wound or dressing.  There is a breaking open of the wound (edges not staying together) after sutures or staples have been removed. MAKE SURE YOU:   Understand these instructions.  Will watch your condition.  Will get help right away if you are not doing well or get worse. Document Released: 06/15/2005 Document Revised: 10/17/2011 Document Reviewed: 05/14/2008 [facility] Patient Information 2015 [place], [place]. This information is not intended to replace advice given to you by your health care provider. Make sure you discuss any questions you have with your health care provider.   Please monitor for new or worsening signs or symptoms, follow-up immediately if any present. If symptoms continue to persist please follow-up with hand surgery for further evaluation.

## 2015-09-07 ENCOUNTER — Emergency Department (HOSPITAL_COMMUNITY)
Admission: EM | Admit: 2015-09-07 | Discharge: 2015-09-08 | Disposition: A | Discharge status: Home / Self Care | Payer: BLUE CROSS/BLUE SHIELD | Attending: Emergency Medicine | Admitting: Emergency Medicine

## 2015-09-07 ENCOUNTER — Encounter (HOSPITAL_COMMUNITY): Payer: Self-pay | Admitting: *Deleted

## 2015-09-07 DIAGNOSIS — Z8719 Personal history of other diseases of the digestive system: Secondary | ICD-10-CM | POA: Insufficient documentation

## 2015-09-07 DIAGNOSIS — F17210 Nicotine dependence, cigarettes, uncomplicated: Secondary | ICD-10-CM | POA: Insufficient documentation

## 2015-09-07 DIAGNOSIS — G43009 Migraine without aura, not intractable, without status migrainosus: Secondary | ICD-10-CM

## 2015-09-07 DIAGNOSIS — G43909 Migraine, unspecified, not intractable, without status migrainosus: Secondary | ICD-10-CM | POA: Insufficient documentation

## 2015-09-07 DIAGNOSIS — F419 Anxiety disorder, unspecified: Secondary | ICD-10-CM | POA: Insufficient documentation

## 2015-09-07 LAB — BASIC METABOLIC PANEL
Anion gap: 10 (ref 5–15)
BUN: 16 mg/dL (ref 6–20)
CO2: 27 mmol/L (ref 22–32)
Calcium: 9.4 mg/dL (ref 8.9–10.3)
Chloride: 102 mmol/L (ref 101–111)
Creatinine, Ser: 0.8 mg/dL (ref 0.61–1.24)
GFR calc Af Amer: >60 mL/min (ref >60)
GFR calc non Af Amer: >60 mL/min (ref >60)
Glucose, Bld: 108 mg/dL — ABNORMAL HIGH (ref 65–99)
POTASSIUM: 3.4 mmol/L — AB (ref 3.5–5.1)
Sodium: 139 mmol/L (ref 135–145)

## 2015-09-07 LAB — CBC WITH DIFFERENTIAL/PLATELET
Basophils Absolute: 0 10*3/uL (ref 0.0–0.1)
Basophils Relative: 0 %
EOS ABS: 0.2 10*3/uL (ref 0.0–0.7)
Eosinophils Relative: 4 %
HCT: 49.1 % (ref 39.0–52.0)
Hemoglobin: 16.3 g/dL (ref 13.0–17.0)
LYMPHS ABS: 1.5 10*3/uL (ref 0.7–4.0)
Lymphocytes Relative: 32 %
MCH: 29.1 pg (ref 26.0–34.0)
MCHC: 33.2 g/dL (ref 30.0–36.0)
MCV: 87.5 fL (ref 78.0–100.0)
MONO ABS: 0.7 10*3/uL (ref 0.1–1.0)
Monocytes Relative: 14 %
Neutro Abs: 2.3 10*3/uL (ref 1.7–7.7)
Neutrophils Relative %: 50 %
Platelets: 196 10*3/uL (ref 150–400)
RBC: 5.61 MIL/uL (ref 4.22–5.81)
RDW: 12.7 % (ref 11.5–15.5)
WBC: 4.7 10*3/uL (ref 4.0–10.5)

## 2015-09-07 MED ORDER — BUTALBITAL-APAP-CAFF-COD 50-325-40-30 MG PO CAPS: ORAL_CAPSULE | ORAL | Status: DC

## 2015-09-07 MED ORDER — PROCHLORPERAZINE EDISYLATE 5 MG/ML IJ SOLN
INTRAMUSCULAR | Status: AC
  Administered 2015-09-07: 10 mg via INTRAVENOUS
  Filled 2015-09-07: qty 2

## 2015-09-07 MED ORDER — PROCHLORPERAZINE EDISYLATE 5 MG/ML IJ SOLN
10.0000 mg | Freq: Once | INTRAMUSCULAR | Status: AC
  Administered 2015-09-07: 10 mg via INTRAVENOUS

## 2015-09-07 MED ORDER — DIPHENHYDRAMINE HCL 50 MG/ML IJ SOLN
INTRAMUSCULAR | Status: AC
  Administered 2015-09-07: 25 mg via INTRAVENOUS
  Filled 2015-09-07: qty 1

## 2015-09-07 MED ORDER — DIPHENHYDRAMINE HCL 50 MG/ML IJ SOLN
25.0000 mg | Freq: Once | INTRAMUSCULAR | Status: AC
  Administered 2015-09-07: 25 mg via INTRAVENOUS

## 2015-09-07 MED ORDER — KETOROLAC TROMETHAMINE 30 MG/ML IJ SOLN
30.0000 mg | Freq: Once | INTRAMUSCULAR | Status: AC
  Administered 2015-09-07: 30 mg via INTRAVENOUS

## 2015-09-07 MED ORDER — KETOROLAC TROMETHAMINE 30 MG/ML IJ SOLN
INTRAMUSCULAR | Status: AC
  Administered 2015-09-07: 30 mg via INTRAVENOUS
  Filled 2015-09-07: qty 1

## 2015-09-07 NOTE — ED Notes (Signed)
Pt c/o migraine x 4 days; pt states he has been vomiting and the light hurts his eyes

## 2015-09-07 NOTE — Discharge Instructions (Signed)
Migraine Headache PLEASE SEE DR Santiago Glad, OR A MEMBER OF HIS TEAM FOR ADDITIONAL EVALUATION OF YOUR HEADACHES. FIORINAL CODEINE MAY CAUSE DROWSINESS, USE WITH CAUTION.                                                                                                                                                   A migraine headache is very bad, throbbing pain on one or both sides of your head. Talk to your doctor about what things may bring on (trigger) your migraine headaches. HOME CARE  Only take medicines as told by your doctor.  Lie down in a dark, quiet room when you have a migraine.  Keep a journal to find out if certain things bring on migraine headaches. For example, write down:  What you eat and drink.  How much sleep you get.  Any change to your diet or medicines.  Lessen how much alcohol you drink.  Quit smoking if you smoke.  Get enough sleep.  Lessen any stress in your life.  Keep lights dim if bright lights bother you or make your migraines worse. GET HELP RIGHT AWAY IF:   Your migraine becomes really bad.  You have a fever.  You have a stiff neck.  You have trouble seeing.  Your muscles are weak, or you lose muscle control.  You lose your balance or have trouble walking.  You feel like you will pass out (faint), or you pass out.  You have really bad symptoms that are different than your first symptoms. MAKE SURE YOU:   Understand these instructions.  Will watch your condition.  Will get help right away if you are not doing well or get worse.   This information is not intended to replace advice given to you by your health care provider. Make sure you discuss any questions you have with your health care provider.   Document Released: 05/03/2008 Document Revised: 10/17/2011 Document Reviewed: 04/01/2013 Elsevier Interactive Patient Education [employer]

## 2015-09-09 NOTE — ED Provider Notes (Signed)
CSN: [id number]     Arrival date & time 09/07/15  1943 History   First MD Initiated Contact with Patient 09/07/15 2152     Chief Complaint  Patient presents with  . Migraine     (Consider location/radiation/quality/duration/timing/severity/associated sxs/prior Treatment) HPI Comments: Patient complains of headache in both temples and across the 4 head. No recent injury. No changes in medications. No changes in diet.  Patient is a 25 y.o. male presenting with migraines. The history is provided by the patient.  Migraine This is a recurrent problem. The current episode started today. The problem occurs intermittently. The problem has been gradually worsening. Associated symptoms include headaches and nausea. Pertinent negatives include no arthralgias, fever or weakness. Exacerbated by: bright lights and loud noises. He has tried NSAIDs (BC powders) for the symptoms. The treatment provided mild relief.    Past Medical History  Diagnosis Date  . Migraine   . Unspecified disease of the salivary glands    Past Surgical History  Procedure Laterality Date  . Throat surgery     History reviewed. No pertinent family history. Social History  Substance Use Topics  . Smoking status: Current Every Day Smoker -- 0.50 packs/day    Types: Cigarettes  . Smokeless tobacco: None  . Alcohol Use: No    Review of Systems  Constitutional: Negative for fever.  Gastrointestinal: Positive for nausea.  Musculoskeletal: Negative for arthralgias.  Neurological: Positive for headaches. Negative for syncope, facial asymmetry and weakness.  All other systems reviewed and are negative.     Allergies  Review of patient's allergies indicates no known allergies.  Home Medications   Prior to Admission medications   Medication Sig Start Date End Date Taking? Authorizing Provider  Aspirin-Salicylamide-Caffeine (BC HEADACHE) 325-95-16 MG TABS Take 1 packet by mouth daily as needed (for headache/pain).    Yes Historical Provider, MD  butalbital-acetaminophen-caffeine (FIORICET/CODEINE) 50-325-40-30 MG capsule 1 or 2 q6h prn headache. 09/07/15   Ivery Quale, PA-C   BP 139/72 mmHg  Pulse 62  Temp(Src) 99.2 F (37.3 C) (Tympanic)  Resp 16  Ht  (1.88 m)  Wt 90.719 kg  BMI 25.67 kg/m2  SpO2 97% Physical Exam  Constitutional: He is oriented to person, place, and time. He appears well-developed and well-nourished.  Non-toxic appearance.  HENT:  Head: Normocephalic.  Right Ear: Tympanic membrane and external ear normal.  Left Ear: Tympanic membrane and external ear normal.  Eyes: EOM and lids are normal. Pupils are equal, round, and reactive to light.  Neck: Normal range of motion. Neck supple. Carotid bruit is not present.  Cardiovascular: Normal rate, regular rhythm, normal heart sounds, intact distal pulses and normal pulses.   Pulmonary/Chest: Breath sounds normal. No respiratory distress.  Abdominal: Soft. Bowel sounds are normal. There is no tenderness. There is no guarding.  Musculoskeletal: Normal range of motion.  Lymphadenopathy:       Head (right side): No submandibular adenopathy present.       Head (left side): No submandibular adenopathy present.    He has no cervical adenopathy.  Neurological: He is alert and oriented to person, place, and time. He has normal strength. No cranial nerve deficit or sensory deficit. He exhibits normal muscle tone. Coordination normal.  Gait steady. No gross neuro deficit noted.  Skin: Skin is warm and dry.  Psychiatric: His speech is normal. His mood appears anxious.  Nursing note and vitals reviewed.   ED Course  Procedures (including critical care time) Labs Review Labs Reviewed  BASIC METABOLIC PANEL - Abnormal; Notable for the following:    Potassium 3.4 (*)    Glucose, Bld 108 (*)    All other components within normal limits  CBC WITH DIFFERENTIAL/PLATELET    Imaging Review No results found. I have personally reviewed and  evaluated these images and lab results as part of my medical decision-making.   EKG Interpretation None      MDM  No gross neurologic deficits appreciated on examination at this time. Complete blood count is well within normal limits. Basic metabolic panel was nonacute.  The patient was treated in the emergency department with intravenous Benadryl, Compazine, Toradol cocktail with excellent results. The patient is given a prescription for fioricet codeine every 6 hours, and referred to the headache wellness Center in [place].    Final diagnoses:  Migraine without aura and without status migrainosus, not intractable    *I have reviewed nursing notes, vital signs, and all appropriate lab and imaging results for this patient.[address], PA-C 09/09/15 1627  Glynn Octave, MD 09/10/15 [phone]

## 2015-12-06 ENCOUNTER — Emergency Department (HOSPITAL_COMMUNITY)
Admission: EM | Admit: 2015-12-06 | Discharge: 2015-12-06 | Discharge status: Against Medical Advice | Payer: BLUE CROSS/BLUE SHIELD | Attending: Emergency Medicine | Admitting: Emergency Medicine

## 2015-12-06 ENCOUNTER — Encounter (HOSPITAL_COMMUNITY): Payer: Self-pay | Admitting: Emergency Medicine

## 2015-12-06 DIAGNOSIS — F17210 Nicotine dependence, cigarettes, uncomplicated: Secondary | ICD-10-CM | POA: Insufficient documentation

## 2015-12-06 DIAGNOSIS — R519 Headache, unspecified: Secondary | ICD-10-CM

## 2015-12-06 DIAGNOSIS — Z7982 Long term (current) use of aspirin: Secondary | ICD-10-CM | POA: Insufficient documentation

## 2015-12-06 DIAGNOSIS — Z791 Long term (current) use of non-steroidal anti-inflammatories (NSAID): Secondary | ICD-10-CM | POA: Insufficient documentation

## 2015-12-06 DIAGNOSIS — R51 Headache: Secondary | ICD-10-CM | POA: Insufficient documentation

## 2015-12-06 MED ORDER — KETOROLAC TROMETHAMINE 30 MG/ML IJ SOLN
60.0000 mg | Freq: Once | INTRAMUSCULAR | Status: AC
  Administered 2015-12-06: 60 mg via INTRAMUSCULAR
  Filled 2015-12-06: qty 2

## 2015-12-06 MED ORDER — METOCLOPRAMIDE HCL 5 MG/ML IJ SOLN
10.0000 mg | Freq: Once | INTRAMUSCULAR | Status: DC
  Filled 2015-12-06: qty 2

## 2015-12-06 MED ORDER — ONDANSETRON 4 MG PO TBDP
4.0000 mg | ORAL_TABLET | Freq: Once | ORAL | Status: AC
  Administered 2015-12-06: 4 mg via ORAL
  Filled 2015-12-06: qty 1

## 2015-12-06 MED ORDER — DIPHENHYDRAMINE HCL 50 MG/ML IJ SOLN
25.0000 mg | Freq: Once | INTRAMUSCULAR | Status: DC
  Filled 2015-12-06: qty 1

## 2015-12-06 MED ORDER — KETOROLAC TROMETHAMINE 30 MG/ML IJ SOLN
30.0000 mg | Freq: Once | INTRAMUSCULAR | Status: DC
  Filled 2015-12-06: qty 1

## 2015-12-06 NOTE — ED Notes (Signed)
Migraine for last 4 days.  Took Ibuprofen and Excedrin migraine, rates pain 8/10.

## 2015-12-06 NOTE — ED Notes (Signed)
Patient came to nurses desk and states he has an emergency and has to leave. Offered to let MD speak with him. Patient refused.

## 2015-12-06 NOTE — ED Provider Notes (Signed)
CSN: [id number]     Arrival date & time 12/06/15  1616 History   First MD Initiated Contact with Patient 12/06/15 1749     Chief Complaint  Patient presents with  . Migraine     (Consider location/radiation/quality/duration/timing/severity/associated sxs/prior Treatment) Patient is a 25 y.o. male presenting with migraines. The history is provided by the patient (Patient complains of migraine headache for a couple days. This is typical for his headaches).  Migraine This is a recurrent problem. The current episode started 2 days ago. The problem occurs constantly. The problem has not changed since onset.Associated symptoms include headaches. Pertinent negatives include no chest pain and no abdominal pain. Nothing aggravates the symptoms. Nothing relieves the symptoms. He has tried acetaminophen for the symptoms.    Past Medical History  Diagnosis Date  . Migraine   . Unspecified disease of the salivary glands    Past Surgical History  Procedure Laterality Date  . Throat surgery     History reviewed. No pertinent family history. Social History  Substance Use Topics  . Smoking status: Current Every Day Smoker -- 0.50 packs/day    Types: Cigarettes  . Smokeless tobacco: None  . Alcohol Use: No    Review of Systems  Constitutional: Negative for appetite change and fatigue.  HENT: Negative for congestion, ear discharge and sinus pressure.   Eyes: Negative for discharge.  Respiratory: Negative for cough.   Cardiovascular: Negative for chest pain.  Gastrointestinal: Positive for nausea. Negative for abdominal pain and diarrhea.  Genitourinary: Negative for frequency and hematuria.  Musculoskeletal: Negative for back pain.  Skin: Negative for rash.  Neurological: Positive for headaches. Negative for seizures.  Psychiatric/Behavioral: Negative for hallucinations.      Allergies  Review of patient's allergies indicates no known allergies.  Home Medications   Prior to  Admission medications   Medication Sig Start Date End Date Taking? Authorizing Provider  aspirin-acetaminophen-caffeine (EXCEDRIN MIGRAINE) [phone] MG tablet Take 2 tablets by mouth every 6 (six) hours as needed for headache.   Yes Historical Provider, MD  ibuprofen (ADVIL,MOTRIN) 200 MG tablet Take 600-800 mg by mouth every 6 (six) hours as needed for headache.   Yes Historical Provider, MD  butalbital-acetaminophen-caffeine (FIORICET/CODEINE) 50-325-40-30 MG capsule 1 or 2 q6h prn headache. 09/07/15   Ivery QualeHobson Bryant, PA-C   BP 126/68 mmHg  Pulse 89  Temp(Src) 97.8 F (36.6 C) (Oral)  Resp 18  Ht 6\' 2"  (1.88 m)  Wt 185 lb (83.915 kg)  BMI 23.74 kg/m2  SpO2 100% Physical Exam  Constitutional: He is oriented to person, place, and time. He appears well-developed.  HENT:  Head: Normocephalic.  Eyes: Conjunctivae and EOM are normal. No scleral icterus.  Neck: Neck supple. No thyromegaly present.  Cardiovascular: Normal rate and regular rhythm.  Exam reveals no gallop and no friction rub.   No murmur heard. Pulmonary/Chest: No stridor. He has no wheezes. He has no rales. He exhibits no tenderness.  Abdominal: He exhibits no distension. There is no tenderness. There is no rebound.  Musculoskeletal: Normal range of motion. He exhibits no edema.  Lymphadenopathy:    He has no cervical adenopathy.  Neurological: He is oriented to person, place, and time. He exhibits normal muscle tone. Coordination normal.  Skin: No rash noted. No erythema.  Psychiatric: He has a normal mood and affect. His behavior is normal.    ED Course  Procedures (including critical care time) Labs Review Labs Reviewed - No data to display  Imaging Review No  results found. I have personally reviewed and evaluated these images and lab results as part of my medical decision-making.   EKG Interpretation None      MDM   Final diagnoses:  Headache disorder    Patient with migraine headache. Patient given  Toradol IM and Zofran ODT. Shortly after he got the medicine the patient decided he needed to leave AMA because he had an emergency. So patient left    Bethann Berkshire, MD 12/06/15 [phone]

## 2015-12-28 ENCOUNTER — Encounter (HOSPITAL_COMMUNITY): Payer: Self-pay | Admitting: Emergency Medicine

## 2015-12-28 ENCOUNTER — Emergency Department (HOSPITAL_COMMUNITY): Payer: Self-pay

## 2015-12-28 ENCOUNTER — Emergency Department (HOSPITAL_COMMUNITY)
Admission: EM | Admit: 2015-12-28 | Discharge: 2015-12-29 | Disposition: A | Discharge status: Home / Self Care | Payer: Self-pay | Attending: Emergency Medicine | Admitting: Emergency Medicine

## 2015-12-28 DIAGNOSIS — G43909 Migraine, unspecified, not intractable, without status migrainosus: Secondary | ICD-10-CM | POA: Insufficient documentation

## 2015-12-28 DIAGNOSIS — F17210 Nicotine dependence, cigarettes, uncomplicated: Secondary | ICD-10-CM | POA: Insufficient documentation

## 2015-12-28 DIAGNOSIS — J029 Acute pharyngitis, unspecified: Secondary | ICD-10-CM | POA: Insufficient documentation

## 2015-12-28 DIAGNOSIS — Z79899 Other long term (current) drug therapy: Secondary | ICD-10-CM | POA: Insufficient documentation

## 2015-12-28 DIAGNOSIS — R6889 Other general symptoms and signs: Secondary | ICD-10-CM

## 2015-12-28 DIAGNOSIS — G43809 Other migraine, not intractable, without status migrainosus: Secondary | ICD-10-CM

## 2015-12-28 DIAGNOSIS — R197 Diarrhea, unspecified: Secondary | ICD-10-CM | POA: Insufficient documentation

## 2015-12-28 DIAGNOSIS — Z7982 Long term (current) use of aspirin: Secondary | ICD-10-CM | POA: Insufficient documentation

## 2015-12-28 DIAGNOSIS — M791 Myalgia: Secondary | ICD-10-CM | POA: Insufficient documentation

## 2015-12-28 DIAGNOSIS — R0981 Nasal congestion: Secondary | ICD-10-CM | POA: Insufficient documentation

## 2015-12-28 LAB — COMPREHENSIVE METABOLIC PANEL
ALK PHOS: 68 U/L (ref 38–126)
ALT: 15 U/L — ABNORMAL LOW (ref 17–63)
ANION GAP: 10 (ref 5–15)
AST: 18 U/L (ref 15–41)
Albumin: 4.7 g/dL (ref 3.5–5.0)
BILIRUBIN TOTAL: 0.8 mg/dL (ref 0.3–1.2)
BUN: 12 mg/dL (ref 6–20)
CALCIUM: 9.4 mg/dL (ref 8.9–10.3)
CO2: 26 mmol/L (ref 22–32)
Chloride: 102 mmol/L (ref 101–111)
Creatinine, Ser: 0.8 mg/dL (ref 0.61–1.24)
GFR calc Af Amer: >60 mL/min (ref >60)
GFR calc non Af Amer: >60 mL/min (ref >60)
Glucose, Bld: 93 mg/dL (ref 65–99)
Potassium: 4.1 mmol/L (ref 3.5–5.1)
Sodium: 138 mmol/L (ref 135–145)
TOTAL PROTEIN: 8.2 g/dL — AB (ref 6.5–8.1)

## 2015-12-28 LAB — CBC WITH DIFFERENTIAL/PLATELET
BASOS ABS: 0 10*3/uL (ref 0.0–0.1)
BASOS PCT: 0 %
EOS ABS: 0.2 10*3/uL (ref 0.0–0.7)
Eosinophils Relative: 2 %
HEMATOCRIT: 48.5 % (ref 39.0–52.0)
Hemoglobin: 16.2 g/dL (ref 13.0–17.0)
Lymphocytes Relative: 14 %
Lymphs Abs: 1.2 10*3/uL (ref 0.7–4.0)
MCH: 29.1 pg (ref 26.0–34.0)
MCHC: 33.4 g/dL (ref 30.0–36.0)
MCV: 87.1 fL (ref 78.0–100.0)
MONO ABS: 1 10*3/uL (ref 0.1–1.0)
MONOS PCT: 11 %
NEUTROS PCT: 73 %
Neutro Abs: 6.5 10*3/uL (ref 1.7–7.7)
PLATELETS: 202 10*3/uL (ref 150–400)
RBC: 5.57 MIL/uL (ref 4.22–5.81)
RDW: 13.4 % (ref 11.5–15.5)
WBC: 8.9 10*3/uL (ref 4.0–10.5)

## 2015-12-28 LAB — LIPASE, BLOOD: Lipase: 14 U/L (ref 11–51)

## 2015-12-28 LAB — MONONUCLEOSIS SCREEN: MONO SCREEN: NEGATIVE

## 2015-12-28 MED ORDER — NAPROXEN 500 MG PO TABS: 500.0000 mg | ORAL_TABLET | Freq: Two times a day (BID) | ORAL | Status: DC

## 2015-12-28 MED ORDER — METOCLOPRAMIDE HCL 5 MG/ML IJ SOLN
10.0000 mg | INTRAMUSCULAR | Status: AC
Start: 2015-12-28 — End: 2015-12-28
  Administered 2015-12-28: 10 mg via INTRAVENOUS
  Filled 2015-12-28: qty 2

## 2015-12-28 MED ORDER — SODIUM CHLORIDE 0.9 % IV BOLUS (SEPSIS)
1000.0000 mL | Freq: Once | INTRAVENOUS | Status: AC
  Administered 2015-12-28: 1000 mL via INTRAVENOUS

## 2015-12-28 MED ORDER — DIPHENHYDRAMINE HCL 50 MG/ML IJ SOLN
25.0000 mg | Freq: Once | INTRAMUSCULAR | Status: AC
  Administered 2015-12-28: 25 mg via INTRAVENOUS
  Filled 2015-12-28: qty 1

## 2015-12-28 MED ORDER — ALBUTEROL SULFATE HFA 108 (90 BASE) MCG/ACT IN AERS
2.0000 | INHALATION_SPRAY | Freq: Four times a day (QID) | RESPIRATORY_TRACT | Status: DC
  Administered 2015-12-29: 2 via RESPIRATORY_TRACT
  Filled 2015-12-28: qty 6.7

## 2015-12-28 MED ORDER — IPRATROPIUM-ALBUTEROL 0.5-2.5 (3) MG/3ML IN SOLN
3.0000 mL | Freq: Once | RESPIRATORY_TRACT | Status: AC
  Administered 2015-12-28: 3 mL via RESPIRATORY_TRACT
  Filled 2015-12-28: qty 3

## 2015-12-28 MED ORDER — SODIUM CHLORIDE 0.9 % IV SOLN: INTRAVENOUS | Status: DC

## 2015-12-28 MED ORDER — DM-GUAIFENESIN ER 30-600 MG PO TB12
1.0000 | ORAL_TABLET | Freq: Two times a day (BID) | ORAL | Status: DC
Start: 2015-12-28 — End: 2017-05-04

## 2015-12-28 MED ORDER — DEXAMETHASONE SODIUM PHOSPHATE 4 MG/ML IJ SOLN
10.0000 mg | Freq: Once | INTRAMUSCULAR | Status: AC
  Administered 2015-12-28: 10 mg via INTRAVENOUS
  Filled 2015-12-28: qty 3

## 2015-12-28 NOTE — ED Provider Notes (Signed)
CSN: [id number]     Arrival date & time 12/28/15  1446 History  By signing my name below, I, Walter Benitez, attest that this documentation has been prepared under the direction and in the presence of Vanetta Mulders, MD. Electronically Signed: Linus Benitez, ED Scribe. 12/28/2015. 8:42 PM.   Chief Complaint  Patient presents with  . Cough  . Shortness of Breath  . Fever   The history is provided by the patient. No language interpreter was used.   HPI Comments: Walter Benitez is a 25 y.o. male with a PMHx of migraines who presents to the Emergency Department complaining of flu-like symptoms that began 1 week ago. Pt reports productive cough with yellow sputum, congestion, fever Tmax 101 F, diaphoresis, SOB, sore throat, diarrhea, myalgias, and HA. He denies any aggravating or alleviating factors. Pt took Theraflu and ibuprofen 1200 today with mild relief. Pt denies any other complaints at this time. Pt recently quit smoking.   Past Medical History  Diagnosis Date  . Migraine   . Unspecified disease of the salivary glands    Past Surgical History  Procedure Laterality Date  . Throat surgery     History reviewed. No pertinent family history. Social History  Substance Use Topics  . Smoking status: Current Every Day Smoker -- 0.50 packs/day    Types: Cigarettes  . Smokeless tobacco: None  . Alcohol Use: No    Review of Systems  Constitutional: Positive for fever and diaphoresis.  HENT: Positive for congestion and sore throat.   Eyes: Negative for visual disturbance.  Respiratory: Positive for cough and shortness of breath.   Gastrointestinal: Positive for diarrhea. Negative for nausea, vomiting and abdominal pain.  Genitourinary: Negative for dysuria and hematuria.  Musculoskeletal: Positive for myalgias. Negative for joint swelling.  Skin: Negative for rash.  Neurological: Positive for headaches.  Hematological: Does not bruise/bleed easily.  Psychiatric/Behavioral:  Negative for confusion.    Allergies  Review of patient's allergies indicates no known allergies.  Home Medications   Prior to Admission medications   Medication Sig Start Date End Date Taking? Authorizing Provider  aspirin-acetaminophen-caffeine (EXCEDRIN MIGRAINE) [phone] MG tablet Take 2 tablets by mouth every 6 (six) hours as needed for headache.   Yes Historical Provider, MD  butalbital-acetaminophen-caffeine (FIORICET/CODEINE) 50-325-40-30 MG capsule 1 or 2 q6h prn headache. Patient taking differently: Take 1-2 capsules by mouth every 6 (six) hours as needed for headache or migraine.  09/07/15  Yes Ivery Quale, PA-C  Diphenhydramine-PE-APAP (THERAFLU EXPRESSMAX) 12.5-5-325 MG/15ML LIQD Take 10-15 mLs by mouth daily as needed (cold symptoms).   Yes Historical Provider, MD  ibuprofen (ADVIL,MOTRIN) 200 MG tablet Take 600-800 mg by mouth every 6 (six) hours as needed for headache.   Yes Historical Provider, MD  dextromethorphan-guaiFENesin (MUCINEX DM) 30-600 MG 12hr tablet Take 1 tablet by mouth 2 (two) times daily. 12/28/15   Vanetta Mulders, MD  naproxen (NAPROSYN) 500 MG tablet Take 1 tablet (500 mg total) by mouth 2 (two) times daily. 12/28/15   Vanetta Mulders, MD   BP 98/87 mmHg  Pulse 100  Temp(Src) 99.5 F (37.5 C) (Oral)  Resp 16  Ht 6\' 1"  (1.854 m)  Wt 86.183 kg  BMI 25.07 kg/m2  SpO2 97%   Physical Exam  Constitutional: He is oriented to person, place, and time. He appears well-developed and well-nourished.  HENT:  Head: Normocephalic and atraumatic.  Mouth/Throat: Uvula is midline, oropharynx is clear and moist and mucous membranes are normal. No oropharyngeal exudate.  Eyes:  EOM are normal. Pupils are equal, round, and reactive to light. No scleral icterus.  Cardiovascular: Normal rate, regular rhythm and normal heart sounds.   No murmur heard. Pulmonary/Chest: Effort normal. He has wheezes (bilaterally). He has no rales.  Abdominal: Soft. Bowel sounds are  normal. There is no tenderness.  Musculoskeletal: He exhibits no edema or tenderness.  Neurological: He is alert and oriented to person, place, and time. No cranial nerve deficit. He exhibits normal muscle tone. Coordination normal.  Skin: Skin is warm and dry.  Psychiatric: He has a normal mood and affect.  Nursing note and vitals reviewed.   ED Course  Procedures  DIAGNOSTIC STUDIES: Oxygen Saturation is 99% on room air, normal by my interpretation.    COORDINATION OF CARE: 8:33 PM Will give fluids, Benadryl, Decadron, and Reglan. Will order CXR and blood work. Discussed treatment plan with pt at bedside and pt agreed to plan.  Labs Review Labs Reviewed  COMPREHENSIVE METABOLIC PANEL - Abnormal; Notable for the following:    Total Protein 8.2 (*)    ALT 15 (*)    All other components within normal limits  LIPASE, BLOOD  CBC WITH DIFFERENTIAL/PLATELET  MONONUCLEOSIS SCREEN   Results for orders placed or performed during the hospital encounter of 12/28/15  Comprehensive metabolic panel  Result Value Ref Range   Sodium 138 135 - 145 mmol/L   Potassium 4.1 3.5 - 5.1 mmol/L   Chloride 102 101 - 111 mmol/L   CO2 26 22 - 32 mmol/L   Glucose, Bld 93 65 - 99 mg/dL   BUN 12 6 - 20 mg/dL   Creatinine, Ser 1.61 0.61 - 1.24 mg/dL   Calcium 9.4 8.9 - 09.6 mg/dL   Total Protein 8.2 (H) 6.5 - 8.1 g/dL   Albumin 4.7 3.5 - 5.0 g/dL   AST 18 15 - 41 U/L   ALT 15 (L) 17 - 63 U/L   Alkaline Phosphatase 68 38 - 126 U/L   Total Bilirubin 0.8 0.3 - 1.2 mg/dL   GFR calc non Af Amer >60 >60 mL/min   GFR calc Af Amer >60 >60 mL/min   Anion gap 10 5 - 15  Lipase, blood  Result Value Ref Range   Lipase 14 11 - 51 U/L  CBC with Differential/Platelet  Result Value Ref Range   WBC 8.9 4.0 - 10.5 K/uL   RBC 5.57 4.22 - 5.81 MIL/uL   Hemoglobin 16.2 13.0 - 17.0 g/dL   HCT 04.5 40.9 - 81.1 %   MCV 87.1 78.0 - 100.0 fL   MCH 29.1 26.0 - 34.0 pg   MCHC 33.4 30.0 - 36.0 g/dL   RDW 91.4 78.2 -  95.6 %   Platelets 202 150 - 400 K/uL   Neutrophils Relative % 73 %   Neutro Abs 6.5 1.7 - 7.7 K/uL   Lymphocytes Relative 14 %   Lymphs Abs 1.2 0.7 - 4.0 K/uL   Monocytes Relative 11 %   Monocytes Absolute 1.0 0.1 - 1.0 K/uL   Eosinophils Relative 2 %   Eosinophils Absolute 0.2 0.0 - 0.7 K/uL   Basophils Relative 0 %   Basophils Absolute 0.0 0.0 - 0.1 K/uL  Mononucleosis screen  Result Value Ref Range   Mono Screen NEGATIVE NEGATIVE     Imaging Review Dg Chest 2 View  12/28/2015  CLINICAL DATA:  Cough, fever, shortness of breath for 4 days. EXAM: CHEST  2 VIEW COMPARISON:  02/27/2014 FINDINGS: Cardiomediastinal silhouette is normal. Mediastinal  contours appear intact. There is no evidence of focal airspace consolidation, pleural effusion or pneumothorax. Osseous structures are without acute abnormality. Soft tissues are grossly normal. IMPRESSION: No active cardiopulmonary disease. Electronically Signed   By: Ted Mcalpineobrinka  Dimitrova M.D.   On: 12/28/2015 15:30   I have personally reviewed and evaluated these images and lab results as part of my medical decision-making.   EKG Interpretation None      MDM   Final diagnoses:  Flu-like symptoms  Other migraine without status migrainosus, not intractable    The patient recently quit smoking which is a good thing. Patient with flulike illness. Chest x-ray negative for pneumonia. Monospot screen negative no leukocytosis electrolytes without significant abnormalities. Will treat symptomatically for the cough and congestion in the bodyaches. Patient received albuterol Atrovent nebulizer here with resolution of the wheezing which was mild. Patient will be discharged home with albuterol inhaler and will take Mucinex DM and Naprosyn for symptomatic treatment. Will return for any new or worse symptoms.  I personally performed the services described in this documentation, which was scribed in my presence. The recorded information has been  reviewed and is accurate.      Vanetta MuldersScott Alayzha An, MD 12/28/15 2352

## 2015-12-28 NOTE — ED Notes (Addendum)
Patient complaining of cough, fever, and shortness of breath x 4 days. States he is coughing up yellow colored sputum today. States he took theraflu and ibuprofen at 1200 today. Patient also complaining of migraine and wants to be treated for this also.

## 2015-12-28 NOTE — Discharge Instructions (Signed)
Use albuterol inhaler 2 puffs every 6 hours for the next 7 days. Then as needed. Take Mucinex DM for cough and phlegm. Take the Naprosyn for bodyaches. Return for any new or worse symptoms. Would expect you to be improving over the next few days.

## 2016-06-02 ENCOUNTER — Emergency Department (HOSPITAL_COMMUNITY): Payer: Self-pay

## 2016-06-02 ENCOUNTER — Emergency Department (HOSPITAL_COMMUNITY)
Admission: EM | Admit: 2016-06-02 | Discharge: 2016-06-02 | Disposition: A | Discharge status: Home / Self Care | Payer: Self-pay | Attending: Emergency Medicine | Admitting: Emergency Medicine

## 2016-06-02 ENCOUNTER — Encounter (HOSPITAL_COMMUNITY): Payer: Self-pay | Admitting: Emergency Medicine

## 2016-06-02 DIAGNOSIS — Z79899 Other long term (current) drug therapy: Secondary | ICD-10-CM | POA: Insufficient documentation

## 2016-06-02 DIAGNOSIS — S91201A Unspecified open wound of right great toe with damage to nail, initial encounter: Secondary | ICD-10-CM | POA: Insufficient documentation

## 2016-06-02 DIAGNOSIS — Y939 Activity, unspecified: Secondary | ICD-10-CM | POA: Insufficient documentation

## 2016-06-02 DIAGNOSIS — Y999 Unspecified external cause status: Secondary | ICD-10-CM | POA: Insufficient documentation

## 2016-06-02 DIAGNOSIS — F17210 Nicotine dependence, cigarettes, uncomplicated: Secondary | ICD-10-CM | POA: Insufficient documentation

## 2016-06-02 DIAGNOSIS — Y929 Unspecified place or not applicable: Secondary | ICD-10-CM | POA: Insufficient documentation

## 2016-06-02 DIAGNOSIS — W268XXA Contact with other sharp object(s), not elsewhere classified, initial encounter: Secondary | ICD-10-CM | POA: Insufficient documentation

## 2016-06-02 DIAGNOSIS — S91209A Unspecified open wound of unspecified toe(s) with damage to nail, initial encounter: Secondary | ICD-10-CM

## 2016-06-02 DIAGNOSIS — Z7982 Long term (current) use of aspirin: Secondary | ICD-10-CM | POA: Insufficient documentation

## 2016-06-02 MED ORDER — ACETAMINOPHEN 500 MG PO TABS
1000.0000 mg | ORAL_TABLET | Freq: Once | ORAL | Status: AC
  Administered 2016-06-02: 1000 mg via ORAL
  Filled 2016-06-02: qty 2

## 2016-06-02 MED ORDER — BUPIVACAINE HCL (PF) 0.5 % IJ SOLN
10.0000 mL | Freq: Once | INTRAMUSCULAR | Status: AC
  Administered 2016-06-02: 10 mL
  Filled 2016-06-02: qty 30

## 2016-06-02 MED ORDER — IBUPROFEN 800 MG PO TABS
800.0000 mg | ORAL_TABLET | Freq: Once | ORAL | Status: AC
  Administered 2016-06-02: 800 mg via ORAL
  Filled 2016-06-02: qty 1

## 2016-06-02 MED ORDER — TRAMADOL HCL 50 MG PO TABS: 100.0000 mg | ORAL_TABLET | Freq: Four times a day (QID) | ORAL | 0 refills | Status: DC | PRN

## 2016-06-02 MED ORDER — CEPHALEXIN 500 MG PO CAPS
500.0000 mg | ORAL_CAPSULE | Freq: Once | ORAL | Status: AC
  Administered 2016-06-02: 500 mg via ORAL
  Filled 2016-06-02: qty 1

## 2016-06-02 MED ORDER — TRAMADOL HCL 50 MG PO TABS
100.0000 mg | ORAL_TABLET | Freq: Once | ORAL | Status: AC
  Administered 2016-06-02: 100 mg via ORAL
  Filled 2016-06-02: qty 2

## 2016-06-02 MED ORDER — CEPHALEXIN 500 MG PO CAPS: 500.0000 mg | ORAL_CAPSULE | Freq: Three times a day (TID) | ORAL | 0 refills | Status: DC

## 2016-06-02 NOTE — ED Triage Notes (Signed)
Pt c/o right great toe pain after stumping it earlier today.

## 2016-06-02 NOTE — ED Provider Notes (Signed)
[facility] DEPT Provider Note   CSN: [id number] Arrival date & time: 06/02/16  0053  Time seen 01:28 AM   History   Chief Complaint Chief Complaint  Patient presents with  . Foot Pain    HPI Walter Benitez is a 25 y.o. male.  HPI  Patient states tonight he stopped his right great toe on a metal leg of a futon bed. This happened about 9 PM. He states the toenail had partially ripped off the nailbed and he laid back down. He complains of a lot of pain over the proximal portion of his right great toe. He states there was initially a lot of bleeding.  PCP none  Past Medical History:  Diagnosis Date  . Migraine   . Unspecified disease of the salivary glands     There are no active problems to display for this patient.   Past Surgical History:  Procedure Laterality Date  . THROAT SURGERY         Home Medications    Prior to Admission medications   Medication Sig Start Date End Date Taking? Authorizing Provider  aspirin-acetaminophen-caffeine (EXCEDRIN MIGRAINE) [phone] MG tablet Take 2 tablets by mouth every 6 (six) hours as needed for headache.    Historical Provider, MD  butalbital-acetaminophen-caffeine (FIORICET/CODEINE) 50-325-40-30 MG capsule 1 or 2 q6h prn headache. Patient taking differently: Take 1-2 capsules by mouth every 6 (six) hours as needed for headache or migraine.  09/07/15   Ivery Quale, PA-C  cephALEXin (KEFLEX) 500 MG capsule Take 1 capsule (500 mg total) by mouth 3 (three) times daily. 06/02/16   Devoria Albe, MD  dextromethorphan-guaiFENesin (MUCINEX DM) 30-600 MG 12hr tablet Take 1 tablet by mouth 2 (two) times daily. 12/28/15   Vanetta Mulders, MD  Diphenhydramine-PE-APAP (THERAFLU EXPRESSMAX) 12.5-5-325 MG/15ML LIQD Take 10-15 mLs by mouth daily as needed (cold symptoms).    Historical Provider, MD  ibuprofen (ADVIL,MOTRIN) 200 MG tablet Take 600-800 mg by mouth every 6 (six) hours as needed for headache.    Historical Provider, MD    naproxen (NAPROSYN) 500 MG tablet Take 1 tablet (500 mg total) by mouth 2 (two) times daily. 12/28/15   Vanetta Mulders, MD  traMADol (ULTRAM) 50 MG tablet Take 2 tablets (100 mg total) by mouth every 6 (six) hours as needed. 06/02/16   Devoria Albe, MD    Family History History reviewed. No pertinent family history.  Social History Social History  Substance Use Topics  . Smoking status: Current Every Day Smoker    Packs/day: 0.50    Types: Cigarettes  . Smokeless tobacco: Current User  . Alcohol use No  employed Denies smoking to me   Allergies   Review of patient's allergies indicates no known allergies.   Review of Systems Review of Systems  All other systems reviewed and are negative.    Physical Exam Updated Vital Signs BP 132/80   Pulse 93   Temp 97.6 F (36.4 C) (Oral)   Resp 16   Ht 6\' 2"  (1.88 m)   Wt 190 lb (86.2 kg)   SpO2 98%   BMI 24.39 kg/m   Vital signs normal    Physical Exam  Constitutional: He is oriented to person, place, and time. He appears well-developed and well-nourished.  Non-toxic appearance. He does not appear ill. No distress.  HENT:  Head: Normocephalic and atraumatic.  Right Ear: External ear normal.  Left Ear: External ear normal.  Nose: Nose normal. No mucosal edema or rhinorrhea.  Mouth/Throat: Mucous  membranes are normal. No dental abscesses or uvula swelling.  Eyes: Conjunctivae and EOM are normal.  Neck: Normal range of motion and full passive range of motion without pain.  Cardiovascular: Normal rate.   Pulmonary/Chest: Effort normal. No respiratory distress. He has no rhonchi. He exhibits no crepitus.  Abdominal: Normal appearance.  Musculoskeletal: Normal range of motion. He exhibits tenderness. He exhibits no edema.       Feet:  Moves all extremities well. Nontender and his right ankle, right foot, however he is tender over the proximal phalanx of the right great toe. He will not let me touch the nail. Currently the  nail is laying in position although there is some blood seen around the nailbed. There is no subungual hematoma seen.  Neurological: He is alert and oriented to person, place, and time. He has normal strength. No cranial nerve deficit.  Skin: Skin is warm, dry and intact. No rash noted. No erythema. No pallor.  Psychiatric: He has a normal mood and affect. His speech is normal and behavior is normal. His mood appears not anxious.  Nursing note and vitals reviewed.    ED Treatments / Results  Labs (all labs ordered are listed, but only abnormal results are displayed) Labs Reviewed - No data to display  EKG  EKG Interpretation None       Radiology Dg Toe Great Right  Result Date: 06/02/2016 CLINICAL DATA:  Hit right great toe on metal bed frame EXAM: RIGHT GREAT TOE COMPARISON:  None. FINDINGS: There is no evidence of fracture or dislocation. There is no evidence of arthropathy or other focal bone abnormality. Soft tissues are unremarkable. IMPRESSION: Negative. Electronically Signed   By: Jasmine PangKim  Fujinaga M.D.   On: 06/02/2016 02:25    Procedures Procedures (including critical care time)  NERVE BLOCK Performed by: Ward GivensIva L Macallan Ord Consent: Verbal consent obtained. Required items: required blood products, implants, devices, and special equipment available Time out: Immediately prior to procedure a "time out" was called to verify the correct patient, procedure, equipment, support staff and site/side marked as required.  Indication: partial avulsion toenail right great toe Nerve block body site: right great toe  Preparation: Patient was prepped and draped in the usual sterile fashion. Needle gauge: 24 G Location technique: anatomical landmarks  Local anesthetic: 0.5 % marcaine  Anesthetic total: 4 ml  Outcome: pain improved Patient tolerance: Patient tolerated the procedure well with no immediate complications.  Partial avulsion of toenail Performed by: Ward GivensIva L Stefanos Haynesworth Authorized  by: Ward GivensIva L Deval Mroczka Consent: Verbal consent obtained. Risks and benefits: risks, benefits and alternatives were discussed Consent given by: patient Patient identity confirmed: provided demographic data Prepped and Draped in normal sterile fashion Wound explored The patient's nail was only loose on the distal lateral edge. It was still intact the majority of the perimeter of the nail. I discussed with the patient he would be more painful if I had to remove what is mainly a intact nail. One suture was placed to keep the loose edge intact and I feel that he will have better healing with this. He has a prior finger injury with deformity of his nail. We discussed depending on how much injury occurred at the nailbed which is hopefully minimal he may or may not have a deformed nail permanently.   Skin closure: 4-0 nylon  Number of sutures: 1  Technique: simple interrupted  Patient tolerance: Patient tolerated the procedure well with no immediate complications.   Medications Ordered in ED Medications  acetaminophen (TYLENOL) tablet 1,000 mg (1,000 mg Oral Given 06/02/16 0207)  ibuprofen (ADVIL,MOTRIN) tablet 800 mg (800 mg Oral Given 06/02/16 0207)  bupivacaine (MARCAINE) 0.5 % injection 10 mL (10 mLs Infiltration Given 06/02/16 0306)  traMADol (ULTRAM) tablet 100 mg (100 mg Oral Given 06/02/16 0327)  cephALEXin (KEFLEX) capsule 500 mg (500 mg Oral Given 06/02/16 0327)     Initial Impression / Assessment and Plan / ED Course  I have reviewed the triage vital signs and the nursing notes.  Pertinent labs & imaging results that were available during my care of the patient were reviewed by me and considered in my medical decision making (see chart for details).  Clinical Course   Patient was given oral pain medicine. X-ray was obtained of his toe.  2:35 AM patient was given copy of his x-ray. We discussed removal of the toenail which is what he wants. We discussed getting a digital block and he  is agreeable.  Patient was started on Keflex. He was given tramadol for continued complaints of pain despite Motrin, acetaminophen, and digital block. We discussed the sutures should be removed in about 10 days.  Review of the [place] shows no entries in the last 6 months.  Final Clinical Impressions(s) / ED Diagnoses   Final diagnoses:  Toenail avulsion, initial encounter    New Prescriptions Discharge Medication List as of 06/02/2016  3:20 AM    START taking these medications   Details  cephALEXin (KEFLEX) 500 MG capsule Take 1 capsule (500 mg total) by mouth 3 (three) times daily., Starting Thu 06/02/2016, Print    traMADol (ULTRAM) 50 MG tablet Take 2 tablets (100 mg total) by mouth every 6 (six) hours as needed., Starting Thu 06/02/2016, Print       Plan discharge  Devoria Albe, MD, Concha Pyo, MD 06/02/16 [phone]

## 2016-06-02 NOTE — Discharge Instructions (Signed)
Elevate your foot. Use ice packs for comfort. Take the tramadol for pain with ibuprofen 600 mg + acetaminophen 1000 mg 4 times a day for pain. Take the antibiotics until gone. Clean with soap and water.  Recheck if it gets infected (increased redness,worsening swelling pain, or you see red streaks running up your foot). The suture should be removed in about 10 days.

## 2016-10-27 ENCOUNTER — Emergency Department (HOSPITAL_COMMUNITY)
Admission: EM | Admit: 2016-10-27 | Discharge: 2016-10-27 | Disposition: A | Discharge status: Home / Self Care | Payer: Self-pay

## 2016-10-27 NOTE — ED Triage Notes (Signed)
Unable to locate pt in waiting room.

## 2017-02-28 ENCOUNTER — Encounter (HOSPITAL_COMMUNITY): Payer: Self-pay | Admitting: Emergency Medicine

## 2017-02-28 ENCOUNTER — Emergency Department (HOSPITAL_COMMUNITY): Payer: Self-pay

## 2017-02-28 ENCOUNTER — Emergency Department (HOSPITAL_COMMUNITY)
Admission: EM | Admit: 2017-02-28 | Discharge: 2017-02-28 | Disposition: A | Discharge status: Home / Self Care | Payer: Self-pay | Attending: Emergency Medicine | Admitting: Emergency Medicine

## 2017-02-28 DIAGNOSIS — Y929 Unspecified place or not applicable: Secondary | ICD-10-CM | POA: Insufficient documentation

## 2017-02-28 DIAGNOSIS — F17210 Nicotine dependence, cigarettes, uncomplicated: Secondary | ICD-10-CM | POA: Insufficient documentation

## 2017-02-28 DIAGNOSIS — Z79899 Other long term (current) drug therapy: Secondary | ICD-10-CM | POA: Insufficient documentation

## 2017-02-28 DIAGNOSIS — Y939 Activity, unspecified: Secondary | ICD-10-CM | POA: Insufficient documentation

## 2017-02-28 DIAGNOSIS — X58XXXA Exposure to other specified factors, initial encounter: Secondary | ICD-10-CM | POA: Insufficient documentation

## 2017-02-28 DIAGNOSIS — Y999 Unspecified external cause status: Secondary | ICD-10-CM | POA: Insufficient documentation

## 2017-02-28 DIAGNOSIS — S39012A Strain of muscle, fascia and tendon of lower back, initial encounter: Secondary | ICD-10-CM | POA: Insufficient documentation

## 2017-02-28 MED ORDER — PREDNISONE 50 MG PO TABS
60.0000 mg | ORAL_TABLET | Freq: Once | ORAL | Status: AC
  Administered 2017-02-28: 60 mg via ORAL
  Filled 2017-02-28: qty 1

## 2017-02-28 MED ORDER — CYCLOBENZAPRINE HCL 5 MG PO TABS: 5.0000 mg | ORAL_TABLET | Freq: Three times a day (TID) | ORAL | 0 refills | Status: DC | PRN

## 2017-02-28 MED ORDER — KETOROLAC TROMETHAMINE 60 MG/2ML IM SOLN
60.0000 mg | Freq: Once | INTRAMUSCULAR | Status: AC
  Administered 2017-02-28: 60 mg via INTRAMUSCULAR
  Filled 2017-02-28: qty 2

## 2017-02-28 MED ORDER — HYDROMORPHONE HCL 1 MG/ML IJ SOLN
0.5000 mg | Freq: Once | INTRAMUSCULAR | Status: AC
  Administered 2017-02-28: 0.5 mg via INTRAMUSCULAR
  Filled 2017-02-28: qty 1

## 2017-02-28 MED ORDER — OXYCODONE-ACETAMINOPHEN 5-325 MG PO TABS: 1.0000 | ORAL_TABLET | ORAL | 0 refills | Status: DC | PRN

## 2017-02-28 MED ORDER — HYDROCODONE-ACETAMINOPHEN 5-325 MG PO TABS
1.0000 | ORAL_TABLET | Freq: Once | ORAL | Status: AC
  Administered 2017-02-28: 1 via ORAL
  Filled 2017-02-28: qty 1

## 2017-02-28 NOTE — ED Notes (Signed)
Pt alert & oriented x4, stable gait. Patient given discharge instructions, paperwork & prescription(s). Patient informed not to drive, operate any equipment & handel any important documents 4 hours after taking pain medication. Patient  instructed to stop at the registration desk to finish any additional paperwork. Patient  verbalized understanding. Pt left department w/ no further questions. 

## 2017-02-28 NOTE — ED Provider Notes (Signed)
[facility] Provider Note   CSN: [id number] Arrival date & time: 02/28/17  1712     History   Chief Complaint Chief Complaint  Patient presents with  . Back Pain    HPI [name] is a 26 y.o. male  Presenting with acute  low back pain which has which started this morning with a cough. He has a current uri and when he was coughing this am felt a sudden severe pain in his midline lower back which is point tender mid lumbar and radiates both up and down is lower spine.   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  He was diagnosed with DDD after an MRI he obtained when age 89 but has never seen a back specialist.  There is no radiation of pain and no weakness or numbness in the lower extremities and no urinary or bowel retention or incontinence.  Patient does not have a history of cancer or IVDU.  The patient has tried ice, heat, tylenol without significant relief of symptoms.     .  The history is provided by the patient.    Past Medical History:  Diagnosis Date  . Migraine   . Unspecified disease of the salivary glands     There are no active problems to display for this patient.   Past Surgical History:  Procedure Laterality Date  . THROAT SURGERY         Home Medications    Prior to Admission medications   Medication Sig Start Date End Date Taking? Authorizing Provider  aspirin-acetaminophen-caffeine (EXCEDRIN MIGRAINE) [phone] MG tablet Take 2 tablets by mouth every 6 (six) hours as needed for headache.    [provider]  butalbital-acetaminophen-caffeine (FIORICET/CODEINE) 50-325-40-30 MG capsule 1 or 2 q6h prn headache. Patient taking differently: Take 1-2 capsules by mouth every 6 (six) hours as needed for headache or migraine.  09/07/15   Ivery QualeBryant, Hobson, PA-C  cephALEXin (KEFLEX) 500 MG capsule Take 1 capsule (500 mg total) by mouth 3  (three) times daily. 06/02/16   Devoria AlbeKnapp, Iva, MD  cyclobenzaprine (FLEXERIL) 5 MG tablet Take 1 tablet (5 mg total) by mouth 3 (three) times daily as needed for muscle spasms. 02/28/17   Burgess AmorIdol, Ellenie Salome, PA-C  dextromethorphan-guaiFENesin (MUCINEX DM) 30-600 MG 12hr tablet Take 1 tablet by mouth 2 (two) times daily. 12/28/15   Vanetta MuldersZackowski, Scott, MD  Diphenhydramine-PE-APAP [facility](THERAFLU EXPRESSMAX) 12.5-5-325 MG/15ML LIQD Take 10-15 mLs by mouth daily as needed (cold symptoms).    [provider]  ibuprofen (ADVIL,MOTRIN) 200 MG tablet Take 600-800 mg by mouth every 6 (six) hours as needed for headache.    [provider]  naproxen (NAPROSYN) 500 MG tablet Take 1 tablet (500 mg total) by mouth 2 (two) times daily. 12/28/15   Vanetta MuldersZackowski, Scott, MD  oxyCODONE-acetaminophen (PERCOCET/ROXICET) 5-325 MG tablet Take 1 tablet by mouth every 4 (four) hours as needed. 02/28/17   Burgess AmorIdol, Teddrick Mallari, PA-C  traMADol (ULTRAM) 50 MG tablet Take 2 tablets (100 mg total) by mouth every 6 (six) hours as needed. 06/02/16   Devoria AlbeKnapp, Iva, MD    Family History No family history on file.  Social History Social History  Substance Use Topics  . Smoking status: Current Every Day Smoker    Packs/day: 0.50    Types: Cigarettes  . Smokeless tobacco: Current User  . Alcohol use No     Allergies   Patient has  no known allergies.   Review of Systems Review of Systems  Constitutional: Negative for fever.  Respiratory: Negative for shortness of breath.   Cardiovascular: Negative for chest pain and leg swelling.  Gastrointestinal: Negative for abdominal distention, abdominal pain and constipation.  Genitourinary: Negative for difficulty urinating, dysuria, flank pain, frequency and urgency.  Musculoskeletal: Positive for back pain. Negative for gait problem and joint swelling.  Skin: Negative for rash.  Neurological: Negative for weakness and numbness.     Physical Exam Updated Vital Signs BP (!) 148/85 (BP  Location: Right Arm)   Pulse 79   Temp 98.3 F (36.8 C) (Oral)   Resp 18   SpO2 97%   Physical Exam  Constitutional: He appears well-developed and well-nourished.  HENT:  Head: Normocephalic.  Eyes: Conjunctivae are normal.  Neck: Normal range of motion. Neck supple.  Cardiovascular: Normal rate and intact distal pulses.   Pedal pulses normal.  Pulmonary/Chest: Effort normal.  Abdominal: Soft. Bowel sounds are normal. He exhibits no distension and no mass.  Musculoskeletal: Normal range of motion. He exhibits no edema.       Lumbar back: He exhibits bony tenderness. He exhibits no swelling, no edema and no spasm.       Back:  Localized pain midlumbar. No edema, no spasm.  Neurological: He is alert. He has normal strength. He displays no atrophy and no tremor. No sensory deficit. Gait normal.  Reflex Scores:      Patellar reflexes are 2+ on the right side and 2+ on the left side.      Achilles reflexes are 2+ on the right side and 2+ on the left side. No strength deficit noted in hip and knee flexor and extensor muscle groups.  Ankle flexion and extension intact.  Skin: Skin is warm and dry.  Psychiatric: He has a normal mood and affect.  Nursing note and vitals reviewed.    ED Treatments / Results  Labs (all labs ordered are listed, but only abnormal results are displayed) Labs Reviewed - No data to display  EKG  EKG Interpretation None       Radiology Dg Lumbar Spine Complete  Result Date: 02/28/2017 CLINICAL DATA:  Lumbosacral back pain. EXAM: LUMBAR SPINE - COMPLETE 4+ VIEW COMPARISON:  None. FINDINGS: The alignment is maintained. Vertebral body heights are normal. There is no listhesis. The posterior elements are intact. Disc spaces are preserved. No fracture. Sacroiliac joints are symmetric and normal. IMPRESSION: Negative radiographs of the lumbar spine. Electronically Signed   By: Rubye Oaks M.D.   On: 02/28/2017 18:31    Procedures Procedures  (including critical care time)  Medications Ordered in ED Medications  HYDROcodone-acetaminophen (NORCO/VICODIN) 5-325 MG per tablet 1 tablet (1 tablet Oral Given 02/28/17 1802)  predniSONE (DELTASONE) tablet 60 mg (60 mg Oral Given 02/28/17 1801)  ketorolac (TORADOL) injection 60 mg (60 mg Intramuscular Given 02/28/17 1920)  HYDROmorphone (DILAUDID) injection 0.5 mg (0.5 mg Intramuscular Given 02/28/17 1924)     Initial Impression / Assessment and Plan / ED Course  I have reviewed the triage vital signs and the nursing notes.  Pertinent labs & imaging results that were available during my care of the patient were reviewed by me and considered in my medical decision making (see chart for details).     No neuro deficit on exam or by history to suggest emergent or surgical presentation.  Discussed worsened sx that should prompt immediate re-evaluation including distal weakness, bowel/bladder retention/incontinence.   [place] controlled  substance database reviewed.   Final Clinical Impressions(s) / ED Diagnoses   Final diagnoses:  Strain of lumbar region, initial encounter    New Prescriptions New Prescriptions   CYCLOBENZAPRINE (FLEXERIL) 5 MG TABLET    Take 1 tablet (5 mg total) by mouth 3 (three) times daily as needed for muscle spasms.   OXYCODONE-ACETAMINOPHEN (PERCOCET/ROXICET) 5-325 MG TABLET    Take 1 tablet by mouth every 4 (four) hours as needed.     Burgess Amor, PA-C 02/28/17 1951    Samuel Jester, DO 03/03/17 [phone]

## 2017-02-28 NOTE — ED Triage Notes (Signed)
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  Starts he was up during the night coughing, did not feel anything pop, but had severe low back pain, taking OTC meds today without relief.

## 2017-02-28 NOTE — Discharge Instructions (Signed)
Do not drive within 4 hours of taking oxycodone as this will make you drowsy.  Avoid lifting,  Bending,  Twisting or any other activity that worsens your pain over the next week.  Apply an  icepack  to your lower back for 10-15 minutes every 2 hours for the next 2 days.  You may also try a heating pad 20 minutes several times daily. You should get rechecked if your symptoms are not better over the next 5 days,  Or you develop increased pain,  Weakness in your leg(s) or loss of bladder or bowel function - these are symptoms of a worsening condition.

## 2017-05-04 ENCOUNTER — Emergency Department (HOSPITAL_COMMUNITY)
Admission: EM | Admit: 2017-05-04 | Discharge: 2017-05-04 | Disposition: A | Discharge status: Home / Self Care | Payer: Self-pay | Attending: Emergency Medicine | Admitting: Emergency Medicine

## 2017-05-04 DIAGNOSIS — G43801 Other migraine, not intractable, with status migrainosus: Secondary | ICD-10-CM | POA: Insufficient documentation

## 2017-05-04 DIAGNOSIS — F17210 Nicotine dependence, cigarettes, uncomplicated: Secondary | ICD-10-CM | POA: Insufficient documentation

## 2017-05-04 MED ORDER — PROMETHAZINE HCL 25 MG PO TABS: 25.0000 mg | ORAL_TABLET | Freq: Four times a day (QID) | ORAL | 0 refills | Status: DC | PRN

## 2017-05-04 MED ORDER — DEXAMETHASONE SODIUM PHOSPHATE 10 MG/ML IJ SOLN
10.0000 mg | Freq: Once | INTRAMUSCULAR | Status: AC
  Administered 2017-05-04: 10 mg via INTRAMUSCULAR
  Filled 2017-05-04: qty 1

## 2017-05-04 MED ORDER — BUTALBITAL-APAP-CAFF-COD 50-325-40-30 MG PO CAPS: 1.0000 | ORAL_CAPSULE | Freq: Four times a day (QID) | ORAL | 0 refills | Status: DC | PRN

## 2017-05-04 MED ORDER — KETOROLAC TROMETHAMINE 60 MG/2ML IM SOLN
60.0000 mg | Freq: Once | INTRAMUSCULAR | Status: AC
  Administered 2017-05-04: 60 mg via INTRAMUSCULAR
  Filled 2017-05-04: qty 2

## 2017-05-04 NOTE — ED Triage Notes (Signed)
He has had a migraine for 3 days

## 2017-05-04 NOTE — ED Provider Notes (Signed)
[facility] Provider Note   CSN: [id number] Arrival date & time: 05/04/17  4098     History   Chief Complaint Chief Complaint  Patient presents with  . Migraine    HPI Walter Benitez is a 26 y.o. male.  Patient presents to the emergency part for evaluation of migraine headache. Patient reports that he has frequent migraine headaches, usually he gets better when he takes Excedrin Migraine. He does report he does occasionally need to come to the ER for migraines that do not resolve with Excedrin Migraine. This particular headache started 3 days ago. He reports a sharp and throbbing pain across the frontal area of his head with light sensitivity, nausea no vomiting. No fever, neck pain. This is typical of migraines he has had in the past, no unusual features. Pain came on slowly, no thunderclap headache. This is not the worse headache of his life.      Past Medical History:  Diagnosis Date  . Migraine   . Unspecified disease of the salivary glands     There are no active problems to display for this patient.   Past Surgical History:  Procedure Laterality Date  . THROAT SURGERY         Home Medications    Prior to Admission medications   Medication Sig Start Date End Date Taking? Authorizing Provider  aspirin-acetaminophen-caffeine (EXCEDRIN MIGRAINE) [phone] MG tablet Take 2 tablets by mouth every 6 (six) hours as needed for headache.   Yes [provider]  butalbital-acetaminophen-caffeine (FIORICET/CODEINE) 50-325-40-30 MG capsule Take 1-2 capsules by mouth every 6 (six) hours as needed for headache or migraine. 05/04/17   Gilda Crease, MD    Family History No family history on file.  Social History Social History  Substance Use Topics  . Smoking status: Current Every Day Smoker    Packs/day: 0.50    Types: Cigarettes  . Smokeless tobacco: Current User  . Alcohol use No     Allergies   Patient has no known  allergies.   Review of Systems Review of Systems  Eyes: Positive for photophobia.  Neurological: Positive for headaches.  All other systems reviewed and are negative.    Physical Exam Updated Vital Signs BP 126/82   Pulse 76   Temp 97.9 F (36.6 C) (Oral)   Resp 18   Ht  (1.88 m)   Wt 81.6 kg (180 lb)   SpO2 100%   BMI 23.11 kg/m   Physical Exam  Constitutional: He is oriented to person, place, and time. He appears well-developed and well-nourished. No distress.  HENT:  Head: Normocephalic and atraumatic.  Right Ear: Hearing normal.  Left Ear: Hearing normal.  Nose: Nose normal.  Mouth/Throat: Oropharynx is clear and moist and mucous membranes are normal.  Eyes: Pupils are equal, round, and reactive to light. Conjunctivae and EOM are normal.  Neck: Normal range of motion. Neck supple.  Cardiovascular: Regular rhythm, S1 normal and S2 normal.  Exam reveals no gallop and no friction rub.   No murmur heard. Pulmonary/Chest: Effort normal and breath sounds normal. No respiratory distress. He exhibits no tenderness.  Abdominal: Soft. Normal appearance and bowel sounds are normal. There is no hepatosplenomegaly. There is no tenderness. There is no rebound, no guarding, no tenderness at McBurney's point and negative Murphy's sign. No hernia.  Musculoskeletal: Normal range of motion.  Neurological: He is alert and oriented to person, place, and time. He has normal strength. No cranial nerve deficit or  sensory deficit. Coordination normal. GCS eye subscore is 4. GCS verbal subscore is 5. GCS motor subscore is 6.  Skin: Skin is warm, dry and intact. No rash noted. No cyanosis.  Psychiatric: He has a normal mood and affect. His speech is normal and behavior is normal. Thought content normal.  Nursing note and vitals reviewed.    ED Treatments / Results  Labs (all labs ordered are listed, but only abnormal results are displayed) Labs Reviewed - No data to display  EKG   EKG Interpretation None       Radiology No results found.  Procedures Procedures (including critical care time)  Medications Ordered in ED Medications  ketorolac (TORADOL) injection 60 mg (not administered)  dexamethasone (DECADRON) injection 10 mg (not administered)     Initial Impression / Assessment and Plan / ED Course  I have reviewed the triage vital signs and the nursing notes.  Pertinent labs & imaging results that were available during my care of the patient were reviewed by me and considered in my medical decision making (see chart for details).     Patient presents to the emergency department for evaluation of migraine headache. Patient is currently experiencing a headache that is similar to previous migraines. Patient does not have any unusual features compared to previous migraines. Patient has normal neurologic examination. There are no unusual features, such as unusual intensity or sudden onset. As this headache is similar to previous migraines, there is no concern for subarachnoid hemorrhage or other etiology. Patient therefore does not require imaging. Patient treated as migraine headache.  Final Clinical Impressions(s) / ED Diagnoses   Final diagnoses:  Other migraine with status migrainosus, not intractable    New Prescriptions Current Discharge Medication List       Gilda Crease, MD 05/04/17 [phone]

## 2017-07-31 ENCOUNTER — Emergency Department (HOSPITAL_COMMUNITY): Payer: Self-pay

## 2017-07-31 ENCOUNTER — Emergency Department (HOSPITAL_COMMUNITY)
Admission: EM | Admit: 2017-07-31 | Discharge: 2017-07-31 | Disposition: A | Discharge status: Home / Self Care | Payer: Self-pay | Attending: Emergency Medicine | Admitting: Emergency Medicine

## 2017-07-31 ENCOUNTER — Other Ambulatory Visit: Payer: Self-pay

## 2017-07-31 ENCOUNTER — Encounter (HOSPITAL_COMMUNITY): Payer: Self-pay | Admitting: *Deleted

## 2017-07-31 DIAGNOSIS — J40 Bronchitis, not specified as acute or chronic: Secondary | ICD-10-CM

## 2017-07-31 DIAGNOSIS — F17210 Nicotine dependence, cigarettes, uncomplicated: Secondary | ICD-10-CM | POA: Insufficient documentation

## 2017-07-31 DIAGNOSIS — Z79899 Other long term (current) drug therapy: Secondary | ICD-10-CM | POA: Insufficient documentation

## 2017-07-31 DIAGNOSIS — J209 Acute bronchitis, unspecified: Secondary | ICD-10-CM | POA: Insufficient documentation

## 2017-07-31 DIAGNOSIS — J069 Acute upper respiratory infection, unspecified: Secondary | ICD-10-CM | POA: Insufficient documentation

## 2017-07-31 DIAGNOSIS — Z7982 Long term (current) use of aspirin: Secondary | ICD-10-CM | POA: Insufficient documentation

## 2017-07-31 MED ORDER — AMOXICILLIN 500 MG PO CAPS: 500.0000 mg | ORAL_CAPSULE | Freq: Three times a day (TID) | ORAL | 0 refills | Status: DC

## 2017-07-31 NOTE — Discharge Instructions (Signed)
Drink plenty of fluids.  You can take ibuprofen 600 mg plus acetaminophen 650 mg every 6 hours as needed for chest wall pain.  Take the antibiotic until gone.  Try to quit smoking!  Recheck if you get a high fever, struggles to breathe, or seems worse.

## 2017-07-31 NOTE — ED Provider Notes (Signed)
[facility] Provider Note   CSN: [id number] Arrival date & time: 07/31/17  0505  Time seen 6:30 AM   History   Chief Complaint Chief Complaint  Patient presents with  . Cough    HPI Walter Benitez is a 26 y.o. male.  HPI patient states he started getting sick 24-36 hours ago.  He states he has central chest pain when he coughs and when he breathes.  He describes it as burning and aching and it is only intermittent when he exerts himself or if he has a coughing spell.  He states he is coughing up yellow sputum and having yellow rhinorrhea.  He denies sore throat but states his throat is scratchy.  He denies fever, chills, nausea or vomiting but he is having posttussive vomiting.  He denies being around anybody else who is ill.  He has been taking Mucinex and DayQuil over-the-counter.  He states he smokes about 2 cigarettes a day and cut back on his cigarettes about 6 months ago.  Patient keeps asking me for a work note and that was the first thing he said to me when I entered the room.  PCP Patient, No Pcp Per   Past Medical History:  Diagnosis Date  . Migraine   . Unspecified disease of the salivary glands     There are no active problems to display for this patient.   Past Surgical History:  Procedure Laterality Date  . THROAT SURGERY         Home Medications    Prior to Admission medications   Medication Sig Start Date End Date Taking? Authorizing Provider  amoxicillin (AMOXIL) 500 MG capsule Take 1 capsule (500 mg total) by mouth 3 (three) times daily. 07/31/17   Devoria AlbeKnapp, Oluwatomisin Hustead, MD  aspirin-acetaminophen-caffeine (EXCEDRIN MIGRAINE) [id number] MG tablet Take 2 tablets by mouth every 6 (six) hours as needed for headache.    [provider]  butalbital-acetaminophen-caffeine (FIORICET/CODEINE) 50-325-40-30 MG capsule Take 1-2 capsules by mouth every 6 (six) hours as needed for headache or migraine. 05/04/17   Gilda CreasePollina, Marques J, MD    promethazine (PHENERGAN) 25 MG tablet Take 1 tablet (25 mg total) by mouth every 6 (six) hours as needed for nausea or vomiting (or migraine). 05/04/17   Gilda CreasePollina, Brady J, MD    Family History No family history on file.  Social History Social History   Tobacco Use  . Smoking status: Current Every Day Smoker    Packs/day: 0.50    Types: Cigarettes  . Smokeless tobacco: Current User  Substance Use Topics  . Alcohol use: No  . Drug use: No  employed   Allergies   Patient has no known allergies.   Review of Systems Review of Systems  All other systems reviewed and are negative.    Physical Exam Updated Vital Signs BP (!) 141/95 (BP Location: Right Arm)   Pulse 63   Temp 97.7 F (36.5 C) (Oral)   Resp 20   Ht 6\' 2"  (1.88 m)   Wt 79.4 kg (175 lb)   SpO2 100%   BMI 22.47 kg/m   Vital signs normal    Physical Exam  Constitutional: He is oriented to person, place, and time. He appears well-developed and well-nourished.  Non-toxic appearance. He does not appear ill. No distress.  HENT:  Head: Normocephalic and atraumatic.  Right Ear: External ear normal.  Left Ear: External ear normal.  Nose: Nose normal. No mucosal edema or rhinorrhea.  Mouth/Throat: Oropharynx  is clear and moist and mucous membranes are normal. No dental abscesses or uvula swelling.  Eyes: Conjunctivae and EOM are normal. Pupils are equal, round, and reactive to light.  Neck: Normal range of motion and full passive range of motion without pain. Neck supple.  Cardiovascular: Normal rate, regular rhythm and normal heart sounds. Exam reveals no gallop and no friction rub.  No murmur heard. Pulmonary/Chest: Effort normal and breath sounds normal. No respiratory distress. He has no wheezes. He has no rhonchi. He has no rales. He exhibits no tenderness and no crepitus.  Abdominal: Normal appearance.  Musculoskeletal: Normal range of motion. He exhibits no edema or tenderness.  Moves all  extremities well.   Neurological: He is alert and oriented to person, place, and time. He has normal strength. No cranial nerve deficit.  Skin: Skin is warm, dry and intact. No rash noted. No erythema. No pallor.  Psychiatric: He has a normal mood and affect. His speech is normal and behavior is normal. His mood appears not anxious.  Nursing note and vitals reviewed.    ED Treatments / Results  Labs (all labs ordered are listed, but only abnormal results are displayed) Labs Reviewed - No data to display  EKG  EKG Interpretation None       Radiology Dg Chest 2 View  Result Date: 07/31/2017 CLINICAL DATA:  Acute onset of productive cough, nasal congestion and vomiting. EXAM: CHEST  2 VIEW COMPARISON:  Chest radiograph performed 12/28/2015 FINDINGS: The lungs are well-aerated and clear. There is no evidence of focal opacification, pleural effusion or pneumothorax. The heart is normal in size; the mediastinal contour is within normal limits. No acute osseous abnormalities are seen. IMPRESSION: No acute cardiopulmonary process seen. Electronically Signed   By: Roanna RaiderJeffery  Chang M.D.   On: 07/31/2017 05:34    Procedures Procedures (including critical care time)  Medications Ordered in ED Medications - No data to display   Initial Impression / Assessment and Plan / ED Course  I have reviewed the triage vital signs and the nursing notes.  Pertinent labs & imaging results that were available during my care of the patient were reviewed by me and considered in my medical decision making (see chart for details).     Patient presents with URI symptoms/bronchitis.  He is only had symptoms less than 2 days, however he is a smoker so he will be started on antibiotics.  He was encouraged to quit smoking.  Final Clinical Impressions(s) / ED Diagnoses   Final diagnoses:  Upper respiratory tract infection, unspecified type  Bronchitis    ED Discharge Orders        Ordered    amoxicillin  (AMOXIL) 500 MG capsule  3 times daily     07/31/17 [id number]      Plan discharge  Devoria AlbeIva Luca Dyar, MD, Concha PyoFACEP    Berklie Dethlefs, MD 07/31/17 [id number]

## 2017-07-31 NOTE — ED Triage Notes (Signed)
Pt states productive cough, nasal congestion & vomiting for the past 2 days.

## 2017-08-04 ENCOUNTER — Emergency Department (HOSPITAL_COMMUNITY): Payer: Self-pay

## 2017-08-04 ENCOUNTER — Emergency Department (HOSPITAL_COMMUNITY)
Admission: EM | Admit: 2017-08-04 | Discharge: 2017-08-04 | Disposition: A | Discharge status: Home / Self Care | Payer: Self-pay | Attending: Emergency Medicine | Admitting: Emergency Medicine

## 2017-08-04 ENCOUNTER — Other Ambulatory Visit: Payer: Self-pay

## 2017-08-04 ENCOUNTER — Encounter (HOSPITAL_COMMUNITY): Payer: Self-pay | Admitting: Emergency Medicine

## 2017-08-04 DIAGNOSIS — J209 Acute bronchitis, unspecified: Secondary | ICD-10-CM

## 2017-08-04 DIAGNOSIS — K2970 Gastritis, unspecified, without bleeding: Secondary | ICD-10-CM | POA: Insufficient documentation

## 2017-08-04 DIAGNOSIS — K2960 Other gastritis without bleeding: Secondary | ICD-10-CM

## 2017-08-04 DIAGNOSIS — F17210 Nicotine dependence, cigarettes, uncomplicated: Secondary | ICD-10-CM | POA: Insufficient documentation

## 2017-08-04 DIAGNOSIS — T39395A Adverse effect of other nonsteroidal anti-inflammatory drugs [NSAID], initial encounter: Secondary | ICD-10-CM

## 2017-08-04 DIAGNOSIS — R0981 Nasal congestion: Secondary | ICD-10-CM | POA: Insufficient documentation

## 2017-08-04 DIAGNOSIS — Z79899 Other long term (current) drug therapy: Secondary | ICD-10-CM | POA: Insufficient documentation

## 2017-08-04 DIAGNOSIS — J40 Bronchitis, not specified as acute or chronic: Secondary | ICD-10-CM | POA: Insufficient documentation

## 2017-08-04 MED ORDER — GI COCKTAIL ~~LOC~~
ORAL | Status: AC
  Filled 2017-08-04: qty 30

## 2017-08-04 MED ORDER — ONDANSETRON HCL 4 MG PO TABS: 4.0000 mg | ORAL_TABLET | Freq: Four times a day (QID) | ORAL | 0 refills | Status: DC | PRN

## 2017-08-04 MED ORDER — LORATADINE 10 MG PO TABS: 10.0000 mg | ORAL_TABLET | Freq: Every day | ORAL | 0 refills | Status: AC

## 2017-08-04 MED ORDER — OMEPRAZOLE 20 MG PO CPDR: 20.0000 mg | DELAYED_RELEASE_CAPSULE | Freq: Every day | ORAL | 0 refills | Status: DC

## 2017-08-04 MED ORDER — PREDNISONE 20 MG PO TABS: ORAL_TABLET | ORAL | 0 refills | Status: DC

## 2017-08-04 MED ORDER — GI COCKTAIL ~~LOC~~
30.0000 mL | Freq: Once | ORAL | Status: AC
  Administered 2017-08-04: 30 mL via ORAL
  Filled 2017-08-04: qty 30

## 2017-08-04 MED ORDER — ALBUTEROL SULFATE (2.5 MG/3ML) 0.083% IN NEBU
5.0000 mg | INHALATION_SOLUTION | Freq: Once | RESPIRATORY_TRACT | Status: AC
  Administered 2017-08-04: 5 mg via RESPIRATORY_TRACT
  Filled 2017-08-04: qty 6

## 2017-08-04 MED ORDER — ALBUTEROL SULFATE HFA 108 (90 BASE) MCG/ACT IN AERS
1.0000 | INHALATION_SPRAY | RESPIRATORY_TRACT | Status: DC | PRN
  Administered 2017-08-04: 1 via RESPIRATORY_TRACT
  Filled 2017-08-04: qty 6.7

## 2017-08-04 MED ORDER — FLUTICASONE PROPIONATE 50 MCG/ACT NA SUSP: 2.0000 | Freq: Every day | NASAL | 0 refills | Status: AC

## 2017-08-04 NOTE — ED Triage Notes (Addendum)
PT reports continued congested cough with fever at times and body aches for the past 6 days. PT states was seen in ED on 07/31/17 but not any better. PT states he was unable to fill his amoxicillin because he doesn't get paid until this week.

## 2017-08-04 NOTE — ED Provider Notes (Signed)
[facility] Provider Note   CSN: [id number] Arrival date & time: 08/04/17  [id number]     History   Chief Complaint Chief Complaint  Patient presents with  . Cough    HPI Walter Benitez is a 26 y.o. male.  HPI Patient with persistent nonproductive cough, sinus pressure and nasal congestion.  He also complains of upper abdominal pain and multiple episodes of vomiting.  No blood in the stool or vomit.  Patient does admit to taking 6-800 mg of ibuprofen every 4-6 hours.  He also is taking Excedrin migraine.  States he had a fever to 101 yesterday evening.  Was seen in the emergency department 4 days ago and was prescribed antibiotics at the time.  Did not get those antibiotics filled. Past Medical History:  Diagnosis Date  . Migraine   . Unspecified disease of the salivary glands     There are no active problems to display for this patient.   Past Surgical History:  Procedure Laterality Date  . THROAT SURGERY         Home Medications    Prior to Admission medications   Medication Sig Start Date End Date Taking? Authorizing Provider  amoxicillin (AMOXIL) 500 MG capsule Take 1 capsule (500 mg total) by mouth 3 (three) times daily. 07/31/17   Devoria AlbeKnapp, Iva, MD  aspirin-acetaminophen-caffeine (EXCEDRIN MIGRAINE) [phone] MG tablet Take 2 tablets by mouth every 6 (six) hours as needed for headache.    [provider]  butalbital-acetaminophen-caffeine (FIORICET/CODEINE) 50-325-40-30 MG capsule Take 1-2 capsules by mouth every 6 (six) hours as needed for headache or migraine. 05/04/17   Gilda CreasePollina, Corley J, MD  fluticasone (FLONASE) 50 MCG/ACT nasal spray Place 2 sprays into both nostrils daily. 08/04/17   Loren RacerYelverton, Aleynah Rocchio, MD  loratadine (CLARITIN) 10 MG tablet Take 1 tablet (10 mg total) by mouth daily. 08/04/17   Loren RacerYelverton, Stana Bayon, MD  omeprazole (PRILOSEC) 20 MG capsule Take 1 capsule (20 mg total) by mouth daily. 08/04/17   Loren RacerYelverton, Chaddrick Brue,  MD  ondansetron (ZOFRAN) 4 MG tablet Take 1 tablet (4 mg total) by mouth every 6 (six) hours as needed for nausea or vomiting. 08/04/17   Loren RacerYelverton, Kyoko Elsea, MD  predniSONE (DELTASONE) 20 MG tablet 3 tabs po day one, then 2 po daily x 4 days 08/04/17   Loren RacerYelverton, Audrey Thull, MD  promethazine (PHENERGAN) 25 MG tablet Take 1 tablet (25 mg total) by mouth every 6 (six) hours as needed for nausea or vomiting (or migraine). 05/04/17   Gilda CreasePollina, Kahleb J, MD    Family History History reviewed. No pertinent family history.  Social History Social History   Tobacco Use  . Smoking status: Current Every Day Smoker    Packs/day: 0.50    Types: Cigarettes  . Smokeless tobacco: Current User  Substance Use Topics  . Alcohol use: No  . Drug use: No     Allergies   Patient has no known allergies.   Review of Systems Review of Systems  Constitutional: Positive for chills, fatigue and fever.  HENT: Positive for congestion and sinus pressure. Negative for trouble swallowing.   Respiratory: Positive for cough. Negative for shortness of breath and wheezing.   Cardiovascular: Negative for chest pain, palpitations and leg swelling.  Gastrointestinal: Positive for abdominal pain, nausea and vomiting. Negative for blood in stool, constipation and diarrhea.  Genitourinary: Negative for dysuria, flank pain and frequency.  Musculoskeletal: Positive for myalgias. Negative for back pain, neck pain and neck stiffness.  Skin: Negative  for rash and wound.  Neurological: Positive for headaches. Negative for dizziness, weakness, light-headedness and numbness.  All other systems reviewed and are negative.    Physical Exam Updated Vital Signs BP 138/76 (BP Location: Left Arm)   Pulse 91   Temp 97.9 F (36.6 C) (Oral)   Resp 18   Ht 6\' 2"  (1.88 m)   Wt 79.4 kg (175 lb)   SpO2 100%   BMI 22.47 kg/m   Physical Exam  Constitutional: He is oriented to person, place, and time. He appears well-developed and  well-nourished.  HENT:  Head: Normocephalic and atraumatic.  Mouth/Throat: Oropharynx is clear and moist.  Bilateral nasal mucosal edema.  Patient has bilateral frontal sinus tenderness to percussion.  Oropharynx is mildly erythematous without tonsillar exudates.  Uvula is midline.  Eyes: EOM are normal. Pupils are equal, round, and reactive to light.  Neck: Normal range of motion. Neck supple.  No cervical lymphadenopathy.  No meningismus.  Cardiovascular: Normal rate and regular rhythm. Exam reveals no gallop and no friction rub.  No murmur heard. Pulmonary/Chest: Effort normal.  Mildly diminished breath sounds especially in the bilateral bases.  Abdominal: Soft. Bowel sounds are normal. There is no tenderness. There is no rebound and no guarding.  Musculoskeletal: Normal range of motion. He exhibits no edema or tenderness.  No lower extremity swelling, asymmetry or tenderness.  No midline thoracic or lumbar tenderness.  Neurological: He is alert and oriented to person, place, and time.  Moving all extremities without focal deficit.  Sensation fully intact.  Skin: Skin is warm and dry. Capillary refill takes less than 2 seconds. No rash noted. No erythema.  Psychiatric: He has a normal mood and affect. His behavior is normal.  Nursing note and vitals reviewed.    ED Treatments / Results  Labs (all labs ordered are listed, but only abnormal results are displayed) Labs Reviewed - No data to display  EKG  EKG Interpretation None       Radiology Dg Chest 2 View  Result Date: 08/04/2017 CLINICAL DATA:  Cough and fever EXAM: CHEST  2 VIEW COMPARISON:  July 31, 2017 FINDINGS: There is no edema or consolidation. Heart size and pulmonary vascularity are normal. No adenopathy. No bone lesions. IMPRESSION: No edema or consolidation. Electronically Signed   By: Bretta Bang III M.D.   On: 08/04/2017 08:27    Procedures Procedures (including critical care time)  Medications  Ordered in ED Medications  albuterol (PROVENTIL HFA;VENTOLIN HFA) 108 (90 Base) MCG/ACT inhaler 1-2 puff (not administered)  gi cocktail (Maalox,Lidocaine,Donnatal) (30 mLs Oral Given 08/04/17 0854)  albuterol (PROVENTIL) (2.5 MG/3ML) 0.083% nebulizer solution 5 mg (5 mg Nebulization Given 08/04/17 0830)     Initial Impression / Assessment and Plan / ED Course  I have reviewed the triage vital signs and the nursing notes.  Pertinent labs & imaging results that were available during my care of the patient were reviewed by me and considered in my medical decision making (see chart for details).     Patient states he is feeling better after nebulized treatment and GI cocktail.  Likely has NSAID induced gastritis.  Also suspect viral sinusitis and bronchitis with bronchospasm.  Will start on short course of steroids.  Given inhaler in the emergency department.  Return precautions given.  Final Clinical Impressions(s) / ED Diagnoses   Final diagnoses:  Bronchitis with bronchospasm  Nasal sinus congestion  NSAID induced gastritis    ED Discharge Orders  Ordered    predniSONE (DELTASONE) 20 MG tablet     08/04/17 0922    loratadine (CLARITIN) 10 MG tablet  Daily     08/04/17 0922    omeprazole (PRILOSEC) 20 MG capsule  Daily     08/04/17 0922    ondansetron (ZOFRAN) 4 MG tablet  Every 6 hours PRN     08/04/17 0922    fluticasone (FLONASE) 50 MCG/ACT nasal spray  Daily     08/04/17 0922       Loren RacerYelverton, Corrina Steffensen, MD 08/04/17 [phone]

## 2017-08-04 NOTE — Discharge Instructions (Signed)
Avoid all NSAIDs including ibuprofen.  Take medications as prescribed.

## 2018-01-06 ENCOUNTER — Other Ambulatory Visit: Payer: Self-pay

## 2018-01-06 ENCOUNTER — Emergency Department (HOSPITAL_COMMUNITY)
Admission: EM | Admit: 2018-01-06 | Discharge: 2018-01-06 | Disposition: A | Discharge status: Home / Self Care | Payer: Self-pay | Attending: Emergency Medicine | Admitting: Emergency Medicine

## 2018-01-06 ENCOUNTER — Encounter (HOSPITAL_COMMUNITY): Payer: Self-pay | Admitting: Emergency Medicine

## 2018-01-06 DIAGNOSIS — K029 Dental caries, unspecified: Secondary | ICD-10-CM | POA: Insufficient documentation

## 2018-01-06 DIAGNOSIS — Z79899 Other long term (current) drug therapy: Secondary | ICD-10-CM | POA: Insufficient documentation

## 2018-01-06 DIAGNOSIS — K0889 Other specified disorders of teeth and supporting structures: Secondary | ICD-10-CM

## 2018-01-06 MED ORDER — DICLOFENAC SODIUM 75 MG PO TBEC: 75.0000 mg | DELAYED_RELEASE_TABLET | Freq: Two times a day (BID) | ORAL | 0 refills | Status: DC

## 2018-01-06 MED ORDER — CLINDAMYCIN HCL 150 MG PO CAPS: 300.0000 mg | ORAL_CAPSULE | Freq: Four times a day (QID) | ORAL | 0 refills | Status: DC

## 2018-01-06 NOTE — Discharge Instructions (Addendum)
Follow-up with a dentist soon.  You can contact one of the providers on the list provided.

## 2018-01-06 NOTE — ED Provider Notes (Signed)
[facility] EMERGENCY DEPARTMENT Provider Note   CSN: [id number] Arrival date & time: 01/06/18  1410     History   Chief Complaint Chief Complaint  Patient presents with  . Dental Pain    HPI Walter Benitez is a 27 y.o. male.  HPI  Walter Benitez is a 27 y.o. male with a recent dental cavity, presents to the Emergency Department with persistent right lower dental pain.  Symptoms have been present for 1 week.  He describes intermittent sharp pains that radiate from his right lower jaw to his ear.  Pain is associated with chewing and sensation of cold foods or liquids.  He has been trying Orajel and other over-the-counter anesthetics without relief.  He states he does not currently have funds or insurance to see a dentist.  He denies difficulty swallowing, fever, chills, neck pain, or facial swelling.  Past Medical History:  Diagnosis Date  . Migraine   . Unspecified disease of the salivary glands     There are no active problems to display for this patient.   Past Surgical History:  Procedure Laterality Date  . THROAT SURGERY        Home Medications    Prior to Admission medications   Medication Sig Start Date End Date Taking? Authorizing Provider  amoxicillin (AMOXIL) 500 MG capsule Take 1 capsule (500 mg total) by mouth 3 (three) times daily. 07/31/17   Devoria Albe, MD  aspirin-acetaminophen-caffeine (EXCEDRIN MIGRAINE) [phone] MG tablet Take 2 tablets by mouth every 6 (six) hours as needed for headache.    [provider]  butalbital-acetaminophen-caffeine (FIORICET/CODEINE) 50-325-40-30 MG capsule Take 1-2 capsules by mouth every 6 (six) hours as needed for headache or migraine. 05/04/17   Gilda Crease, MD  fluticasone (FLONASE) 50 MCG/ACT nasal spray Place 2 sprays into both nostrils daily. 08/04/17   Loren Racer, MD  loratadine (CLARITIN) 10 MG tablet Take 1 tablet (10 mg total) by mouth daily. 08/04/17   Loren Racer, MD    omeprazole (PRILOSEC) 20 MG capsule Take 1 capsule (20 mg total) by mouth daily. 08/04/17   Loren Racer, MD  ondansetron (ZOFRAN) 4 MG tablet Take 1 tablet (4 mg total) by mouth every 6 (six) hours as needed for nausea or vomiting. 08/04/17   Loren Racer, MD  predniSONE (DELTASONE) 20 MG tablet 3 tabs po day one, then 2 po daily x 4 days 08/04/17   Loren Racer, MD  promethazine (PHENERGAN) 25 MG tablet Take 1 tablet (25 mg total) by mouth every 6 (six) hours as needed for nausea or vomiting (or migraine). 05/04/17   Gilda Crease, MD    Family History History reviewed. No pertinent family history.  Social History Social History   Tobacco Use  . Smoking status: Current Every Day Smoker    Packs/day: 0.50    Types: Cigarettes  . Smokeless tobacco: Current User  Substance Use Topics  . Alcohol use: No  . Drug use: No     Allergies   Patient has no known allergies.   Review of Systems Review of Systems  Constitutional: Negative for appetite change and fever.  HENT: Positive for dental problem. Negative for congestion, facial swelling, sore throat and trouble swallowing.   Eyes: Negative for pain and visual disturbance.  Musculoskeletal: Negative for neck pain and neck stiffness.  Neurological: Negative for dizziness, facial asymmetry and headaches.  Hematological: Negative for adenopathy.  All other systems reviewed and are negative.    Physical Exam  Updated Vital Signs BP (!) 152/92 (BP Location: Right Arm)   Pulse 73   Temp 98.1 F (36.7 C) (Oral)   Resp 16   Ht 6\' 1"  (1.854 m)   Wt 81.6 kg (180 lb)   SpO2 100%   BMI 23.75 kg/m   Physical Exam  Constitutional: He is oriented to person, place, and time. He appears well-developed and well-nourished. No distress.  HENT:  Head: Normocephalic and atraumatic.  Right Ear: Tympanic membrane and ear canal normal.  Left Ear: Tympanic membrane and ear canal normal.  Mouth/Throat: Uvula is midline,  oropharynx is clear and moist and mucous membranes are normal. No trismus in the jaw. Dental caries present. No dental abscesses or uvula swelling.  Tenderness and dental caries of the lower first molar.  No facial swelling, obvious dental abscess, trismus, or sublingual abnml.    Neck: Normal range of motion. Neck supple.  Cardiovascular: Normal rate, regular rhythm and normal heart sounds.  No murmur heard. Pulmonary/Chest: Effort normal and breath sounds normal.  Musculoskeletal: Normal range of motion.  Lymphadenopathy:    He has no cervical adenopathy.  Neurological: He is alert and oriented to person, place, and time. He exhibits normal muscle tone. Coordination normal.  Skin: Skin is warm and dry. Capillary refill takes less than 2 seconds.  Nursing note and vitals reviewed.    ED Treatments / Results  Labs (all labs ordered are listed, but only abnormal results are displayed) Labs Reviewed - No data to display  EKG None  Radiology No results found.  Procedures Procedures (including critical care time)  Medications Ordered in ED Medications - No data to display   Initial Impression / Assessment and Plan / ED Course  I have reviewed the triage vital signs and the nursing notes.  Pertinent labs & imaging results that were available during my care of the patient were reviewed by me and considered in my medical decision making (see chart for details).     Patient is well-appearing.  Airway is patent.  Dental cavity without abscess.  No concerning signs for Ludwig's angina.  Patient given dental referral information and prescription for NSAID and antibiotic.  Final Clinical Impressions(s) / ED Diagnoses   Final diagnoses:  Pain, dental    ED Discharge Orders    None       Pauline Ausriplett, Lolita Faulds, PA-C 01/06/18 1515    Walter Benitez, Kathleen, DO 01/07/18 1123

## 2018-01-06 NOTE — ED Triage Notes (Signed)
Patient complaining of bottom right dental pain x 1 week. States he had tooth break from area recently.

## 2018-04-20 ENCOUNTER — Emergency Department (HOSPITAL_COMMUNITY)
Admission: EM | Admit: 2018-04-20 | Discharge: 2018-04-20 | Disposition: A | Discharge status: Home / Self Care | Payer: Self-pay | Attending: Emergency Medicine | Admitting: Emergency Medicine

## 2018-04-20 ENCOUNTER — Encounter (HOSPITAL_COMMUNITY): Payer: Self-pay | Admitting: Emergency Medicine

## 2018-04-20 ENCOUNTER — Other Ambulatory Visit: Payer: Self-pay

## 2018-04-20 DIAGNOSIS — R51 Headache: Secondary | ICD-10-CM | POA: Insufficient documentation

## 2018-04-20 DIAGNOSIS — Z79899 Other long term (current) drug therapy: Secondary | ICD-10-CM | POA: Insufficient documentation

## 2018-04-20 DIAGNOSIS — F17210 Nicotine dependence, cigarettes, uncomplicated: Secondary | ICD-10-CM | POA: Insufficient documentation

## 2018-04-20 DIAGNOSIS — R519 Headache, unspecified: Secondary | ICD-10-CM

## 2018-04-20 MED ORDER — ONDANSETRON 4 MG PO TBDP
4.0000 mg | ORAL_TABLET | Freq: Once | ORAL | Status: AC
  Administered 2018-04-20: 4 mg via ORAL
  Filled 2018-04-20: qty 1

## 2018-04-20 MED ORDER — BUTALBITAL-APAP-CAFFEINE 50-325-40 MG PO TABS: 1.0000 | ORAL_TABLET | Freq: Four times a day (QID) | ORAL | 0 refills | Status: DC | PRN

## 2018-04-20 MED ORDER — ACETAMINOPHEN 325 MG PO TABS
650.0000 mg | ORAL_TABLET | Freq: Once | ORAL | Status: AC
  Administered 2018-04-20: 650 mg via ORAL
  Filled 2018-04-20: qty 2

## 2018-04-20 MED ORDER — KETOROLAC TROMETHAMINE 30 MG/ML IJ SOLN
30.0000 mg | Freq: Once | INTRAMUSCULAR | Status: AC
  Administered 2018-04-20: 30 mg via INTRAMUSCULAR
  Filled 2018-04-20: qty 1

## 2018-04-20 MED ORDER — BUTALBITAL-APAP-CAFFEINE 50-325-40 MG PO TABS
1.0000 | ORAL_TABLET | Freq: Once | ORAL | Status: AC
  Administered 2018-04-20: 1 via ORAL
  Filled 2018-04-20: qty 1

## 2018-04-20 NOTE — ED Provider Notes (Signed)
Patient placed in Quick Look pathway, seen and evaluated   Chief Complaint: Headache  HPI: Patient with history of migraine headaches presents with bilateral frontal temporal headache over the past 2 days associated with phonophobia and vomiting.  Patient states that the symptoms are similar to previous headaches.  He has had to go to the emergency department in the past for these.  He has taken ibuprofen and Excedrin Migraine prior to arrival without improvement.  ROS:  Positive ROS: (+) Headache, vomiting Negative ROS: (-) Vision change, weakness in arms or legs  Physical Exam:   Gen: No distress  Neuro: Awake and Alert  Skin: Warm    Focused Exam: Heart RRR, nml S1,S2, no m/r/g; Lungs CTAB; Abd soft, NT, no rebound or guarding; Ext 2+ pedal pulses bilaterally, no edema; neuro cranial nerves grossly intact, full movement of upper and lower extremities without difficulty, grossly normal coordination  BP (!) 143/88 (BP Location: Right Arm)   Pulse 85   Temp 97.7 F (36.5 C) (Oral)   Resp 16   Ht 6\' 1"  (1.854 m)   Wt 81.6 kg   SpO2 99%   BMI 23.75 kg/m   Plan: We will give IM Toradol.  Patient does not want any kind of medications which make him drowsy because he is driving home.  Also requesting PCP referral.  Initiation of care has begun. The patient has been counseled on the process, plan, and necessity for staying for the completion/evaluation, and the remainder of the medical screening examination    Renne CriglerGeiple, Mileena Rothenberger, Cordelia Poche-C 04/20/18 1737    Mesner, Barbara CowerJason, MD 04/21/18 [phone]

## 2018-04-20 NOTE — ED Provider Notes (Signed)
[facility] EMERGENCY DEPARTMENT Provider Note   CSN: [id number] Arrival date & time: 04/20/18  1708     History   Chief Complaint Chief Complaint  Patient presents with  . Migraine    HPI Walter Benitez is a 27 y.o. male.  HPI   Patient is a 27 year old male with a history of migraines who presents emergency department today committing of headache that began 2 days ago when he woke up.  Reports that the headache is located to the bite lateral frontal temporal region.  Is associated with photophobia, nausea and vomiting.  Denies any vision changes, lightheadedness, dizziness, numbness or weakness in the arms or legs.  Denies worst headache of life.  States it feels consistent with prior migraines.  States that everyone in his family has migraines.  He tried taking ibuprofen and Excedrin at home with no significant relief.  Denies recent falls or trauma.  No fever chills or abdominal pain. Pain constant and severe in nature.  Past Medical History:  Diagnosis Date  . Migraine   . Unspecified disease of the salivary glands     There are no active problems to display for this patient.   Past Surgical History:  Procedure Laterality Date  . THROAT SURGERY          Home Medications    Prior to Admission medications   Medication Sig Start Date End Date Taking? Authorizing Provider  amoxicillin (AMOXIL) 500 MG capsule Take 1 capsule (500 mg total) by mouth 3 (three) times daily. 07/31/17   Devoria Albe, MD  aspirin-acetaminophen-caffeine (EXCEDRIN MIGRAINE) [phone] MG tablet Take 2 tablets by mouth every 6 (six) hours as needed for headache.    [provider]  butalbital-acetaminophen-caffeine (FIORICET, ESGIC) [phone] MG tablet Take 1 tablet by mouth every 6 (six) hours as needed for headache. 04/20/18 04/20/19  Marynell Bies S, PA-C  butalbital-acetaminophen-caffeine (FIORICET/CODEINE) [phone] MG capsule Take 1-2 capsules by mouth  every 6 (six) hours as needed for headache or migraine. 05/04/17   Gilda Crease, MD  clindamycin (CLEOCIN) 150 MG capsule Take 2 capsules (300 mg total) by mouth 4 (four) times daily. 01/06/18   Triplett, Tammy, PA-C  diclofenac (VOLTAREN) 75 MG EC tablet Take 1 tablet (75 mg total) by mouth 2 (two) times daily. Take with food 01/06/18   Triplett, Tammy, PA-C  fluticasone (FLONASE) 50 MCG/ACT nasal spray Place 2 sprays into both nostrils daily. 08/04/17   Loren Racer, MD  loratadine (CLARITIN) 10 MG tablet Take 1 tablet (10 mg total) by mouth daily. 08/04/17   Loren Racer, MD  omeprazole (PRILOSEC) 20 MG capsule Take 1 capsule (20 mg total) by mouth daily. 08/04/17   Loren Racer, MD  ondansetron (ZOFRAN) 4 MG tablet Take 1 tablet (4 mg total) by mouth every 6 (six) hours as needed for nausea or vomiting. 08/04/17   Loren Racer, MD  predniSONE (DELTASONE) 20 MG tablet 3 tabs po day one, then 2 po daily x 4 days 08/04/17   Loren Racer, MD  promethazine (PHENERGAN) 25 MG tablet Take 1 tablet (25 mg total) by mouth every 6 (six) hours as needed for nausea or vomiting (or migraine). 05/04/17   Gilda Crease, MD    Family History History reviewed. No pertinent family history.  Social History Social History   Tobacco Use  . Smoking status: Current Every Day Smoker    Packs/day: 0.25    Types: Cigarettes  . Smokeless tobacco: Former Engineer, water  Use Topics  . Alcohol use: Yes    Comment: occasionally  . Drug use: No     Allergies   Patient has no known allergies.   Review of Systems Review of Systems  Constitutional: Negative for chills and fever.  HENT: Negative for ear pain and sore throat.   Eyes: Positive for photophobia. Negative for pain and visual disturbance.  Respiratory: Negative for cough and shortness of breath.   Cardiovascular: Negative for chest pain and palpitations.  Gastrointestinal: Positive for nausea and vomiting. Negative  for abdominal pain.  Genitourinary: Negative for dysuria.  Musculoskeletal: Negative for back pain.  Skin: Negative for rash.  Neurological: Positive for headaches. Negative for dizziness, weakness, light-headedness and numbness.  All other systems reviewed and are negative.  Physical Exam Updated Vital Signs BP (!) 143/88 (BP Location: Right Arm)   Pulse 85   Temp 97.7 F (36.5 C) (Oral)   Resp 16   Ht 6\' 1"  (1.854 m)   Wt 81.6 kg   SpO2 99%   BMI 23.75 kg/m   Physical Exam  Constitutional: He is oriented to person, place, and time. He appears well-developed and well-nourished.  HENT:  Head: Normocephalic and atraumatic.  Eyes: Pupils are equal, round, and reactive to light. Conjunctivae and EOM are normal.  Neck: Neck supple.  Cardiovascular: Normal rate and regular rhythm.  No murmur heard. Pulmonary/Chest: Effort normal and breath sounds normal. No respiratory distress.  Abdominal: Soft. There is no tenderness.  Musculoskeletal: He exhibits no edema.  Neurological: He is alert and oriented to person, place, and time.  Mental Status:  Alert, thought content appropriate, able to give a coherent history. Speech fluent without evidence of aphasia. Able to follow 2 step commands without difficulty.  Cranial Nerves:  II:  pupils equal, round, reactive to light III,IV, VI: ptosis not present, extra-ocular motions intact bilaterally  V,VII: smile symmetric, facial light touch sensation equal VIII: hearing grossly normal to voice  X: uvula elevates symmetrically  XI: bilateral shoulder shrug symmetric and strong XII: midline tongue extension without fassiculations Motor:  Normal tone. 5/5 strength of BUE and BLE major muscle groups including strong and equal grip strength and dorsiflexion/plantar flexion Sensory: light touch normal in all extremities. Gait: normal gait and balance.  Skin: Skin is warm and dry. Capillary refill takes less than 2 seconds.  Psychiatric: He has  a normal mood and affect.  Nursing note and vitals reviewed.   ED Treatments / Results  Labs (all labs ordered are listed, but only abnormal results are displayed) Labs Reviewed - No data to display  EKG None  Radiology No results found.  Procedures Procedures (including critical care time)  Medications Ordered in ED Medications  ketorolac (TORADOL) 30 MG/ML injection 30 mg (30 mg Intramuscular Given 04/20/18 1841)  butalbital-acetaminophen-caffeine (FIORICET, ESGIC) 50-325-40 MG per tablet 1 tablet (1 tablet Oral Given 04/20/18 1842)  acetaminophen (TYLENOL) tablet 650 mg (650 mg Oral Given 04/20/18 1842)  ondansetron (ZOFRAN-ODT) disintegrating tablet 4 mg (4 mg Oral Given 04/20/18 1841)     Initial Impression / Assessment and Plan / ED Course  I have reviewed the triage vital signs and the nursing notes.  Pertinent labs & imaging results that were available during my care of the patient were reviewed by me and considered in my medical decision making (see chart for details).     Final Clinical Impressions(s) / ED Diagnoses   Final diagnoses:  Nonintractable headache, unspecified chronicity pattern, unspecified headache type  Pt HA treated and improved while in ED.  Presentation is like pts typical HA and non concerning for [facility], ICH, Meningitis, or temporal arteritis. Pt is afebrile with no focal neuro deficits, nuchal rigidity, or change in vision. Pt is to follow up with PCP to discuss prophylactic medication. Pt verbalizes understanding and is agreeable with plan to dc. Advised pt to return for any new or worsening sxs. All questions answered.  ED Discharge Orders         Ordered    butalbital-acetaminophen-caffeine Marikay Alar(FIORICET, ESGIC) 50-325-40 MG tablet  Every 6 hours PRN     04/20/18 1833           Karrie MeresCouture, Sheila Ocasio S, PA-C 04/20/18 1946    Terrilee FilesButler, Michael C, MD 04/21/18 1119

## 2018-04-20 NOTE — Discharge Instructions (Signed)
Please take all medications as prescribed.  Please follow up with your primary care provider within 5-7 days for re-evaluation of your symptoms. If you do not have a primary care provider, information for a healthcare clinic has been provided for you to make arrangements for follow up care. Please return to the emergency department for any new or worsening symptoms.

## 2018-04-20 NOTE — ED Notes (Signed)
Patient verbalizes understanding of discharge instructions. Opportunity for questioning and answers were provided. Armband removed by staff, pt discharged from ED. Pt ambulatory from room.

## 2018-04-20 NOTE — ED Triage Notes (Signed)
Pt reports migraine x 2 days. Pt reports taking Excedrin with no relief. Pt reports nausea and vomiting, and photosensitivity. Pt denies change in vision.

## 2018-06-09 ENCOUNTER — Encounter (HOSPITAL_COMMUNITY): Payer: Self-pay | Admitting: Emergency Medicine

## 2018-06-09 ENCOUNTER — Emergency Department (HOSPITAL_COMMUNITY)
Admission: EM | Admit: 2018-06-09 | Discharge: 2018-06-09 | Disposition: A | Discharge status: Home / Self Care | Payer: Self-pay | Attending: Emergency Medicine | Admitting: Emergency Medicine

## 2018-06-09 DIAGNOSIS — F17210 Nicotine dependence, cigarettes, uncomplicated: Secondary | ICD-10-CM | POA: Insufficient documentation

## 2018-06-09 DIAGNOSIS — K0889 Other specified disorders of teeth and supporting structures: Secondary | ICD-10-CM | POA: Insufficient documentation

## 2018-06-09 DIAGNOSIS — Z79899 Other long term (current) drug therapy: Secondary | ICD-10-CM | POA: Insufficient documentation

## 2018-06-09 MED ORDER — BUPIVACAINE HCL (PF) 0.5 % IJ SOLN
5.0000 mL | Freq: Once | INTRAMUSCULAR | Status: AC
  Administered 2018-06-09: 5 mL
  Filled 2018-06-09: qty 10

## 2018-06-09 MED ORDER — PENICILLIN V POTASSIUM 500 MG PO TABS: 500.0000 mg | ORAL_TABLET | Freq: Four times a day (QID) | ORAL | 0 refills | Status: AC

## 2018-06-09 MED ORDER — LIDOCAINE VISCOUS HCL 2 % MT SOLN: 15.0000 mL | OROMUCOSAL | 0 refills | Status: DC | PRN

## 2018-06-09 NOTE — ED Triage Notes (Signed)
Reports bad tooth that is broke off on right lower side that's been bothering him for months.  Now has insurance but can't see dentist for 2 more weeks.

## 2018-06-09 NOTE — ED Provider Notes (Signed)
[facility] EMERGENCY DEPARTMENT Provider Note   CSN: [id number] Arrival date & time: 06/09/18  1920     History   Chief Complaint Chief Complaint  Patient presents with  . Dental Pain    HPI Walter Benitez is a 27 y.o. male presenting for evaluation of dental pain.  Patient states he has been having issues with his teeth for the past 6 months.  He has a broken tooth on the bottom, which causes him pain intermittently.  Over the past week, pain has worsened significantly.  Pain radiates from his tooth to his jaw.  He denies fevers, chills, difficulty swallowing, nausea, vomiting.  He is not immunocompromise.  He has been taking Tylenol, ibuprofen, Orajel, and other over-the-counter medications without improvement of pain.  Patient states he is unable to sleep due to the pain.  He has no other medical problems and takes no medications daily.  He recently got dental insurance, has an appointment with a dentist in 2 weeks.  HPI  Past Medical History:  Diagnosis Date  . Migraine   . Unspecified disease of the salivary glands     There are no active problems to display for this patient.   Past Surgical History:  Procedure Laterality Date  . THROAT SURGERY          Home Medications    Prior to Admission medications   Medication Sig Start Date End Date Taking? Authorizing Provider  amoxicillin (AMOXIL) 500 MG capsule Take 1 capsule (500 mg total) by mouth 3 (three) times daily. 07/31/17   Devoria Albe, MD  aspirin-acetaminophen-caffeine (EXCEDRIN MIGRAINE) [phone] MG tablet Take 2 tablets by mouth every 6 (six) hours as needed for headache.    [provider]  butalbital-acetaminophen-caffeine (FIORICET, ESGIC) [phone] MG tablet Take 1 tablet by mouth every 6 (six) hours as needed for headache. 04/20/18 04/20/19  Couture, Cortni S, PA-C  butalbital-acetaminophen-caffeine (FIORICET/CODEINE) 50-325-40-30 MG capsule Take 1-2 capsules by mouth  every 6 (six) hours as needed for headache or migraine. 05/04/17   Gilda Crease, MD  clindamycin (CLEOCIN) 150 MG capsule Take 2 capsules (300 mg total) by mouth 4 (four) times daily. 01/06/18   Triplett, Tammy, PA-C  diclofenac (VOLTAREN) 75 MG EC tablet Take 1 tablet (75 mg total) by mouth 2 (two) times daily. Take with food 01/06/18   Triplett, Tammy, PA-C  fluticasone (FLONASE) 50 MCG/ACT nasal spray Place 2 sprays into both nostrils daily. 08/04/17   Loren Racer, MD  lidocaine (XYLOCAINE) 2 % solution Use as directed 15 mLs in the mouth or throat as needed for mouth pain. 06/09/18   Myrta Mercer, PA-C  loratadine (CLARITIN) 10 MG tablet Take 1 tablet (10 mg total) by mouth daily. 08/04/17   Loren Racer, MD  omeprazole (PRILOSEC) 20 MG capsule Take 1 capsule (20 mg total) by mouth daily. 08/04/17   Loren Racer, MD  ondansetron (ZOFRAN) 4 MG tablet Take 1 tablet (4 mg total) by mouth every 6 (six) hours as needed for nausea or vomiting. 08/04/17   Loren Racer, MD  penicillin v potassium (VEETID) 500 MG tablet Take 1 tablet (500 mg total) by mouth 4 (four) times daily for 7 days. 06/09/18 06/16/18  Humaira Sculley, PA-C  predniSONE (DELTASONE) 20 MG tablet 3 tabs po day one, then 2 po daily x 4 days 08/04/17   Loren Racer, MD  promethazine (PHENERGAN) 25 MG tablet Take 1 tablet (25 mg total) by mouth every 6 (six) hours as needed  for nausea or vomiting (or migraine). 05/04/17   Gilda Crease, MD    Family History No family history on file.  Social History Social History   Tobacco Use  . Smoking status: Current Every Day Smoker    Packs/day: 0.25    Types: Cigarettes  . Smokeless tobacco: Former Engineer, water Use Topics  . Alcohol use: Yes    Comment: occasionally  . Drug use: No     Allergies   Patient has no known allergies.   Review of Systems Review of Systems  Constitutional: Negative for fever.  HENT: Positive for dental  problem.      Physical Exam Updated Vital Signs BP (!) 145/90 (BP Location: Right Arm)   Pulse 77   Temp 98.9 F (37.2 C) (Oral)   Resp 14   Ht 6\' 1"  (1.854 m)   Wt 86.2 kg   SpO2 98%   BMI 25.07 kg/m   Physical Exam  Constitutional: He is oriented to person, place, and time. He appears well-developed and well-nourished.  Sitting in the chair and appears very uncomfortable to due to pain.  HENT:  Head: Normocephalic and atraumatic.  Mouth/Throat: Uvula is midline, oropharynx is clear and moist and mucous membranes are normal. No trismus in the jaw. Abnormal dentition. Dental caries present. No uvula swelling.    Poor dental hygiene.  Multiple dental caries.  Right lower tooth cracked and right and with tenderness.  No pain under the tongue.  Airway intact.  Handling secretions easily.  No facial swelling.  Eyes: EOM are normal.  Neck: Normal range of motion.  Cardiovascular: Normal rate, regular rhythm and intact distal pulses.  Pulmonary/Chest: Effort normal and breath sounds normal. No respiratory distress. He has no wheezes.  Abdominal: He exhibits no distension.  Musculoskeletal: Normal range of motion.  Neurological: He is alert and oriented to person, place, and time.  Skin: Skin is warm. No rash noted.  Psychiatric: He has a normal mood and affect.  Nursing note and vitals reviewed.    ED Treatments / Results  Labs (all labs ordered are listed, but only abnormal results are displayed) Labs Reviewed - No data to display  EKG None  Radiology No results found.  Procedures Dental Block Date/Time: 06/09/2018 8:50 PM Performed by: Alveria Apley, PA-C Authorized by: Alveria Apley, PA-C   Consent:    Consent obtained:  Verbal   Consent given by:  Patient   Risks discussed:  Infection, nerve damage, swelling, unsuccessful block, pain and intravascular injection   Alternatives discussed:  No treatment Indications:    Indications: dental pain     Location:    Block type:  Inferior alveolar   Laterality:  Right Procedure details (see MAR for exact dosages):    Syringe type:  Luer lock syringe   Needle gauge:  25 G   Anesthetic injected:  Bupivacaine 0.5% w/o epi   Injection procedure:  Anatomic landmarks identified, anatomic landmarks palpated, introduced needle, incremental injection and negative aspiration for blood Post-procedure details:    Outcome:  Pain relieved   Patient tolerance of procedure:  Tolerated well, no immediate complications   (including critical care time)  Medications Ordered in ED Medications  bupivacaine (MARCAINE) 0.5 % injection 5 mL (5 mLs Infiltration Given 06/09/18 2042)     Initial Impression / Assessment and Plan / ED Course  I have reviewed the triage vital signs and the nursing notes.  Pertinent labs & imaging results that were available during my  care of the patient were reviewed by me and considered in my medical decision making (see chart for details).     Patient presenting for evaluation of dental pain.  Physical examination, he is afebrile not tachycardic.  Appears uncomfortable due to pain.  No signs of Ludwig's.  Patient has been trying multiple over-the-counter medications, and pain has worsened in the past week, will cover for possible dental infection with antibiotics.  Will give viscous lidocaine for pain control.  Offered dental block for temporary relief, patient would like to do so.  Block performed as described above.  Patient tolerated well.  Patient encouraged to keep his appointment with the dentist for further management of his tooth.  At this time, patient appears safe for discharge.  Return precautions given.  Patient states he understands and agrees to plan.  Final Clinical Impressions(s) / ED Diagnoses   Final diagnoses:  Pain, dental    ED Discharge Orders         Ordered    penicillin v potassium (VEETID) 500 MG tablet  4 times daily     06/09/18 2040     lidocaine (XYLOCAINE) 2 % solution  As needed     06/09/18 2040           Alveria Apley, PA-C 06/09/18 2051    Raeford Razor, MD 06/09/18 2138

## 2018-06-09 NOTE — Discharge Instructions (Addendum)
Keep your appointment with your dentist for further evaluation and management of your teeth. Continue using Tylenol and ibuprofen as needed for pain. Take antibiotics as prescribed.  Take the entire course, even if your symptoms improve. Use viscous lidocaine as needed for further pain relief. Return to the emergency room with any new, worsening, concerning symptoms.

## 2018-08-15 ENCOUNTER — Emergency Department (HOSPITAL_COMMUNITY)
Admission: EM | Admit: 2018-08-15 | Discharge: 2018-08-15 | Disposition: A | Discharge status: Home / Self Care | Payer: Self-pay | Attending: Emergency Medicine | Admitting: Emergency Medicine

## 2018-08-15 ENCOUNTER — Other Ambulatory Visit: Payer: Self-pay

## 2018-08-15 ENCOUNTER — Encounter (HOSPITAL_COMMUNITY): Payer: Self-pay | Admitting: Emergency Medicine

## 2018-08-15 DIAGNOSIS — F17210 Nicotine dependence, cigarettes, uncomplicated: Secondary | ICD-10-CM | POA: Insufficient documentation

## 2018-08-15 DIAGNOSIS — J0100 Acute maxillary sinusitis, unspecified: Secondary | ICD-10-CM | POA: Insufficient documentation

## 2018-08-15 DIAGNOSIS — Z79899 Other long term (current) drug therapy: Secondary | ICD-10-CM | POA: Insufficient documentation

## 2018-08-15 MED ORDER — DOXYCYCLINE HYCLATE 100 MG PO CAPS: 100.0000 mg | ORAL_CAPSULE | Freq: Two times a day (BID) | ORAL | 0 refills | Status: DC

## 2018-08-15 MED ORDER — BENZONATATE 100 MG PO CAPS: 100.0000 mg | ORAL_CAPSULE | Freq: Three times a day (TID) | ORAL | 0 refills | Status: DC | PRN

## 2018-08-15 NOTE — Discharge Instructions (Signed)
It was my pleasure taking care of you today!   Please take all of your antibiotics until finished!  Tessalon as needed for cough.  Follow-up with your primary care doctor if your symptoms are not improving after antibiotic course.  If you do not have a primary care doctor, see the information below.  Return to the emergency department for new or worsening symptoms, any additional concerns.  To find a primary care or specialty doctor please call [phone] or [phone] to access "[facility] Find a Doctor Service."  You may also go on the [facility] website at [email or web address]  There are also multiple [facility] and [facility] practices throughout the [place] that are frequently accepting new patients. You may find a clinic that is close to your home and contact them.  [facility] [place] [address] [place] [address] [phone]  [facility] in [place] (also locations in [place] and [place]) - [address] [employer] [place] [phone]  [facility] - [address] [place] [phone]

## 2018-08-15 NOTE — ED Triage Notes (Signed)
Pt c/o nasal congestion, cough and generalized body aches x 5 days.

## 2018-08-15 NOTE — ED Notes (Signed)
Cold cough for 5 days congestion nose and chest feeling exhausted

## 2018-08-15 NOTE — ED Provider Notes (Signed)
[facility] Provider Note   CSN: [id number] Arrival date & time: 08/15/18  1801     History   Chief Complaint Chief Complaint  Patient presents with  . Nasal Congestion    HPI Sol PasserChristopher C Elsey is a 28 y.o. male.  The history is provided by the patient and medical records. No language interpreter was used.   Sol PasserChristopher C Righi is a 28 y.o. male who presents to the Emergency Department complaining of cough, congestion for the last 6 days.  Initially, he thought he had the flu.  He tried multiple over-the-counter cold and flu medications and Motrin.  He felt as if he was getting better, however 2 days ago, his symptoms acutely worsened.  He began having a productive cough and worsening right sinus pressure.  He is a daily smoker.  Denies any fever or chills.  Had a few episodes of posttussive emesis, but no other vomiting.  No abdominal pain or nausea.    Past Medical History:  Diagnosis Date  . Migraine   . Unspecified disease of the salivary glands     There are no active problems to display for this patient.   Past Surgical History:  Procedure Laterality Date  . THROAT SURGERY          Home Medications    Prior to Admission medications   Medication Sig Start Date End Date Taking? Authorizing Provider  aspirin-acetaminophen-caffeine (EXCEDRIN MIGRAINE) [phone] MG tablet Take 2 tablets by mouth every 6 (six) hours as needed for headache.    [provider]  benzonatate (TESSALON) 100 MG capsule Take 1 capsule (100 mg total) by mouth 3 (three) times daily as needed for cough. 08/15/18   Ward, Chase PicketJaime Pilcher, PA-C  butalbital-acetaminophen-caffeine (FIORICET, ESGIC) [phone] MG tablet Take 1 tablet by mouth every 6 (six) hours as needed for headache. 04/20/18 04/20/19  Couture, Cortni S, PA-C  butalbital-acetaminophen-caffeine (FIORICET/CODEINE) 50-325-40-30 MG capsule Take 1-2 capsules by mouth every 6 (six) hours as  needed for headache or migraine. 05/04/17   Gilda CreasePollina, Erick J, MD  clindamycin (CLEOCIN) 150 MG capsule Take 2 capsules (300 mg total) by mouth 4 (four) times daily. 01/06/18   Triplett, Tammy, PA-C  diclofenac (VOLTAREN) 75 MG EC tablet Take 1 tablet (75 mg total) by mouth 2 (two) times daily. Take with food 01/06/18   Triplett, Tammy, PA-C  doxycycline (VIBRAMYCIN) 100 MG capsule Take 1 capsule (100 mg total) by mouth 2 (two) times daily. 08/15/18   Ward, Chase PicketJaime Pilcher, PA-C  fluticasone (FLONASE) 50 MCG/ACT nasal spray Place 2 sprays into both nostrils daily. 08/04/17   Loren RacerYelverton, David, MD  lidocaine (XYLOCAINE) 2 % solution Use as directed 15 mLs in the mouth or throat as needed for mouth pain. 06/09/18   Caccavale, Sophia, PA-C  loratadine (CLARITIN) 10 MG tablet Take 1 tablet (10 mg total) by mouth daily. 08/04/17   Loren RacerYelverton, David, MD  omeprazole (PRILOSEC) 20 MG capsule Take 1 capsule (20 mg total) by mouth daily. 08/04/17   Loren RacerYelverton, David, MD  ondansetron (ZOFRAN) 4 MG tablet Take 1 tablet (4 mg total) by mouth every 6 (six) hours as needed for nausea or vomiting. 08/04/17   Loren RacerYelverton, David, MD  predniSONE (DELTASONE) 20 MG tablet 3 tabs po day one, then 2 po daily x 4 days 08/04/17   Loren RacerYelverton, David, MD  promethazine (PHENERGAN) 25 MG tablet Take 1 tablet (25 mg total) by mouth every 6 (six) hours as needed for nausea or  vomiting (or migraine). 05/04/17   Gilda Crease, MD    Family History No family history on file.  Social History Social History   Tobacco Use  . Smoking status: Current Every Day Smoker    Packs/day: 0.25    Types: Cigarettes  . Smokeless tobacco: Former Engineer, water Use Topics  . Alcohol use: Yes    Comment: occasionally  . Drug use: No     Allergies   Patient has no known allergies.   Review of Systems Review of Systems  Constitutional: Negative for chills and fever.  HENT: Positive for congestion. Negative for sore throat.     Respiratory: Positive for cough. Negative for shortness of breath.   Cardiovascular: Negative for chest pain.  Gastrointestinal: Positive for vomiting (Post tussive). Negative for abdominal pain, constipation, diarrhea and nausea.  Genitourinary: Negative for difficulty urinating and dysuria.  Skin: Negative for rash.  Neurological: Negative for headaches.     Physical Exam Updated Vital Signs BP (!) 135/91 (BP Location: Right Arm)   Pulse 81   Temp 98.4 F (36.9 C) (Oral)   Resp 16   SpO2 100%   Physical Exam Vitals signs and nursing note reviewed.  Constitutional:      General: He is not in acute distress.    Appearance: He is well-developed.  HENT:     Head: Normocephalic and atraumatic.     Nose:     Comments: + Nasal congestion.  Focal sinus tenderness to the right maxillary sinus.    Mouth/Throat:     Comments: Oropharynx with scant erythema.  No tonsillar hypertrophy or exudates. Neck:     Musculoskeletal: Neck supple.  Cardiovascular:     Rate and Rhythm: Normal rate and regular rhythm.     Heart sounds: Normal heart sounds. No murmur.  Pulmonary:     Effort: Pulmonary effort is normal. No respiratory distress.     Breath sounds: Normal breath sounds. No wheezing or rales.     Comments: Lungs are clear to auscultation bilaterally. Musculoskeletal: Normal range of motion.  Skin:    General: Skin is warm and dry.  Neurological:     Mental Status: He is alert.      ED Treatments / Results  Labs (all labs ordered are listed, but only abnormal results are displayed) Labs Reviewed - No data to display  EKG None  Radiology No results found.  Procedures Procedures (including critical care time)  Medications Ordered in ED Medications - No data to display   Initial Impression / Assessment and Plan / ED Course  I have reviewed the triage vital signs and the nursing notes.  Pertinent labs & imaging results that were available during my care of the  patient were reviewed by me and considered in my medical decision making (see chart for details).    Lafayette Meech Mendia is a 28 y.o. male who presents to ED for 1 week of cough, congestion.  He had a few days where he thought he was getting better, then acutely worsened.  Lungs are clear to auscultation bilaterally.  He does have focal sinus tenderness to the right maxillary sinus.  He is a daily smoker.  Given daily smoker, focal area of sinus tenderness and prolonged length of symptoms to 7 days, will treat with doxycycline.  Reasons to return to the emergency department and PCP follow-up were discussed.  All questions were answered.   Final Clinical Impressions(s) / ED Diagnoses   Final diagnoses:  Acute non-recurrent maxillary sinusitis    ED Discharge Orders         Ordered    doxycycline (VIBRAMYCIN) 100 MG capsule  2 times daily     08/15/18 1933    benzonatate (TESSALON) 100 MG capsule  3 times daily PRN     08/15/18 1933           Ward, Chase PicketJaime Pilcher, PA-C 08/15/18 1945    Sabas SousBero, Michael M, MD 08/17/18 1315

## 2018-12-09 ENCOUNTER — Other Ambulatory Visit: Payer: Self-pay

## 2018-12-09 ENCOUNTER — Encounter (HOSPITAL_COMMUNITY): Payer: Self-pay

## 2018-12-09 ENCOUNTER — Emergency Department (HOSPITAL_COMMUNITY)
Admission: EM | Admit: 2018-12-09 | Discharge: 2018-12-10 | Disposition: A | Discharge status: Home / Self Care | Payer: Self-pay | Attending: Emergency Medicine | Admitting: Emergency Medicine

## 2018-12-09 DIAGNOSIS — R110 Nausea: Secondary | ICD-10-CM | POA: Insufficient documentation

## 2018-12-09 DIAGNOSIS — F17210 Nicotine dependence, cigarettes, uncomplicated: Secondary | ICD-10-CM | POA: Insufficient documentation

## 2018-12-09 DIAGNOSIS — R002 Palpitations: Secondary | ICD-10-CM | POA: Insufficient documentation

## 2018-12-09 NOTE — ED Triage Notes (Signed)
Onset today pt woke up felt fine, drank 2 cups coffee, went somewhere to take a walk, 5 min in walk got nauseated, vomited x 1, shaking.  Pt had to leave to go home, took nap. Ate something and now feels same way as did this morning.   Tolerating fluids.  Pt appears anxious. C/o nausea.  Pt just stopped drinking red bulls- would drink 1-2 per day, today started drinking coffee.

## 2018-12-10 MED ORDER — VARENICLINE TARTRATE 0.5 MG PO TABS: 0.5000 mg | ORAL_TABLET | Freq: Two times a day (BID) | ORAL | 0 refills | Status: AC

## 2018-12-10 MED ORDER — LORAZEPAM 1 MG PO TABS: 1.0000 mg | ORAL_TABLET | Freq: Three times a day (TID) | ORAL | 0 refills | Status: DC | PRN

## 2018-12-10 NOTE — ED Provider Notes (Signed)
Emergency Department Provider Note   I have reviewed the triage vital signs and the nursing notes.   HISTORY  Chief Complaint Nausea   HPI Walter Benitez is a 28 y.o. male who presents the emerge department today secondary to nausea, palpitations, sweating and general anxiety for a few hours after drinking a couple cups of coffee when he does not usually drink coffee.  Patient states he went to sleep and woke up and felt the same way so the concern of brought him here for further evaluation.  Patient felt like his heart was going a pound out of his chest.  No fevers or recent illnesses.  No history of the same.  No presyncope.   No other associated or modifying symptoms.    Past Medical History:  Diagnosis Date  . Migraine   . Unspecified disease of the salivary glands     There are no active problems to display for this patient.   Past Surgical History:  Procedure Laterality Date  . THROAT SURGERY      Current Outpatient Rx  . Order #: [id number] Class: Historical Med  . Order #: [id number] Class: Print  . Order #: [id number] Class: Print  . Order #: [id number] Class: Print  . Order #: [id number] Class: Print  . Order #: [id number] Class: Print  . Order #: [id number] Class: Print  . Order #: [id number] Class: Print  . Order #: [id number] Class: Print  . Order #: [id number] Class: Print  . Order #: [id number] Class: Print  . Order #: [id number] Class: Print  . Order #: [id number] Class: Print  . Order #: [id number] Class: Print  . Order #: [id number] Class: Print  . Order #: [id number] Class: Print    Allergies Patient has no known allergies.  History reviewed. No pertinent family history.  Social History Social History   Tobacco Use  . Smoking status: Current Every Day Smoker    Packs/day: 0.25    Types: Cigarettes  . Smokeless tobacco: Former Engineer, water Use Topics  . Alcohol use: Yes    Comment: occasionally  . Drug use: No    Review of Systems  All other  systems negative except as documented in the HPI. All pertinent positives and negatives as reviewed in the HPI. ____________________________________________   PHYSICAL EXAM:  VITAL SIGNS: ED Triage Vitals  Enc Vitals Group     BP 12/10/18 0107 136/85     Pulse Rate 12/10/18 0107 71     Resp 12/10/18 0107 17     Temp 12/10/18 0107 98.4 F (36.9 C)     Temp Source 12/10/18 0107 Oral     SpO2 12/10/18 0107 99 %    Constitutional: Alert and oriented. Well appearing and in no acute distress. Eyes: Conjunctivae are normal. PERRL. EOMI. Head: Atraumatic. Nose: No congestion/rhinnorhea. Mouth/Throat: Mucous membranes are moist.  Oropharynx non-erythematous. Neck: No stridor.  No meningeal signs.   Cardiovascular: Normal rate, regular rhythm. Good peripheral circulation. Grossly normal heart sounds.   Respiratory: Normal respiratory effort.  No retractions. Lungs CTAB. Gastrointestinal: Soft and nontender. No distention.  Musculoskeletal: No lower extremity tenderness nor edema. No gross deformities of extremities. Neurologic:  Normal speech and language. No gross focal neurologic deficits are appreciated.  Skin:  Skin is warm, dry and intact. No rash noted.   ____________________________________________   LABS (all labs ordered are listed, but only abnormal results are displayed)  Labs Reviewed - No data to display ____________________________________________  EKG   EKG Interpretation  Date/Time:  Monday Dec 10 2018 01:07:02 EDT Ventricular Rate:  70 PR Interval:    QRS Duration: 97 QT Interval:  393 QTC Calculation: 424 R Axis:   64 Text Interpretation:  Sinus rhythm Normal ECG No old tracing to compare Confirmed by Dione BoozeGlick, David ([id number]) on 12/10/2018 1:12:37 AM       ____________________________________________  RADIOLOGY  No results found.  ____________________________________________   PROCEDURES  Procedure(s) performed:   Procedures   Counseled  patient for approximately 8 minutes regarding smoking cessation. Discussed risks of smoking and how they applied and affected their visit here today. Patient not ready to quit at this time, however will follow up with their primary doctor when they are.   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  Possibly component of anxiety as well as it came back later when he was thinking about the initial episode.  Low suspicion for SVT, V. tach, pheochromocytoma or other metabolic abnormalities.  Patient's feels well now.  Counseled on smoking cessation and does want to quit so Chantix will be prescribed and he will follow-up with Dr. for further.  Pertinent labs & imaging results that were available during my care of the patient were reviewed by me and considered in my medical decision making (see chart for details).   A medical screening exam was performed and I feel the patient has had an appropriate workup for their chief complaint at this time and likelihood of emergent condition existing is low. They have been counseled on decision, discharge, follow up and which symptoms necessitate immediate return to the emergency department. They or their family verbally stated understanding and agreement with plan and discharged in stable condition.   ____________________________________________  FINAL CLINICAL IMPRESSION(S) / ED DIAGNOSES  Final diagnoses:  Nausea     MEDICATIONS GIVEN DURING THIS VISIT:  Medications - No data to display   NEW OUTPATIENT MEDICATIONS STARTED DURING THIS VISIT:  Discharge Medication List as of 12/10/2018  1:33 AM    START taking these medications   Details  LORazepam (ATIVAN) 1 MG tablet Take 1 tablet (1 mg total) by mouth every 8 (eight) hours as needed for anxiety.,  Starting Mon 12/10/2018, Print    varenicline (CHANTIX) 0.5 MG tablet Take 1 tablet (0.5 mg total) by mouth 2 (two) times daily for 30 days., Starting Mon 12/10/2018, Until Wed 01/09/2019, Print        Note:  This note was prepared with assistance of Dragon voice recognition software. Occasional wrong-word or sound-a-like substitutions may have occurred due to the inherent limitations of voice recognition software.   Haim Hansson, Barbara CowerJason, MD 12/10/18 [phone]

## 2018-12-17 ENCOUNTER — Encounter (HOSPITAL_COMMUNITY): Payer: Self-pay | Admitting: Emergency Medicine

## 2018-12-17 ENCOUNTER — Emergency Department (HOSPITAL_COMMUNITY)
Admission: EM | Admit: 2018-12-17 | Discharge: 2018-12-17 | Disposition: A | Discharge status: Home / Self Care | Payer: Self-pay | Attending: Emergency Medicine | Admitting: Emergency Medicine

## 2018-12-17 ENCOUNTER — Other Ambulatory Visit: Payer: Self-pay

## 2018-12-17 DIAGNOSIS — F17200 Nicotine dependence, unspecified, uncomplicated: Secondary | ICD-10-CM | POA: Insufficient documentation

## 2018-12-17 DIAGNOSIS — Z79899 Other long term (current) drug therapy: Secondary | ICD-10-CM | POA: Insufficient documentation

## 2018-12-17 DIAGNOSIS — K047 Periapical abscess without sinus: Secondary | ICD-10-CM | POA: Insufficient documentation

## 2018-12-17 MED ORDER — PENICILLIN V POTASSIUM 500 MG PO TABS: 500.0000 mg | ORAL_TABLET | Freq: Four times a day (QID) | ORAL | 0 refills | Status: AC

## 2018-12-17 MED ORDER — PENICILLIN V POTASSIUM 250 MG PO TABS
500.0000 mg | ORAL_TABLET | Freq: Once | ORAL | Status: AC
  Administered 2018-12-17: 01:00:00 500 mg via ORAL
  Filled 2018-12-17: qty 2

## 2018-12-17 MED ORDER — BUPIVACAINE HCL (PF) 0.5 % IJ SOLN
10.0000 mL | Freq: Once | INTRAMUSCULAR | Status: AC
  Administered 2018-12-17: 10 mL
  Filled 2018-12-17: qty 10

## 2018-12-17 NOTE — ED Provider Notes (Signed)
[facility] Provider [facility]   CSN: [id number] Arrival date & time: 12/17/18  0025    History   Chief Complaint Chief Complaint  Patient presents with  . Dental Pain    HPI Walter Benitez is a 28 y.o. male.     The history is provided by the patient.  Dental Pain  Location:  Lower Quality:  Aching Severity:  Moderate Onset quality:  Gradual Duration:  1 day Timing:  Constant Progression:  Worsening Chronicity:  New Relieved by:  Nothing Worsened by:  Jaw movement and pressure Associated symptoms: facial swelling   Associated symptoms: no fever     Past Medical History:  Diagnosis Date  . Migraine   . Unspecified disease of the salivary glands     There are no active problems to display for this patient.   Past Surgical History:  Procedure Laterality Date  . THROAT SURGERY          Home Medications    Prior to Admission medications   Medication Sig Start Date End Date Taking? Authorizing Provider  aspirin-acetaminophen-caffeine (EXCEDRIN MIGRAINE) [phone] [id number] MG tablet Take 2 tablets by mouth every 6 (six) hours as needed for headache.    [provider]  fluticasone (FLONASE) 50 MCG/ACT nasal spray Place 2 sprays into both nostrils daily. 08/04/17   Loren RacerYelverton, David, MD  loratadine (CLARITIN) 10 MG tablet Take 1 tablet (10 mg total) by mouth daily. 08/04/17   Loren RacerYelverton, David, MD  penicillin v potassium (VEETID) 500 MG tablet Take 1 tablet (500 mg total) by mouth 4 (four) times daily. 12/17/18   Zadie RhineWickline, Bianey Tesoro, MD  varenicline (CHANTIX) 0.5 MG tablet Take 1 tablet (0.5 mg total) by mouth 2 (two) times daily for 30 days. 12/10/18 01/09/19  Mesner, Barbara CowerJason, MD    Family History No family history on file.  Social History Social History   Tobacco Use  . Smoking status: Current Every Day Smoker    Packs/day: 0.25    Types: Cigarettes  . Smokeless tobacco: Former Engineer, waterUser  Substance Use Topics  . Alcohol  use: Yes    Comment: occasionally  . Drug use: No     Allergies   Patient has no known allergies.   Review of Systems Review of Systems  Constitutional: Negative for fever.  HENT: Positive for facial swelling.      Physical Exam Updated Vital Signs Temp 99 F (37.2 C) (Oral)   Ht 1.88 m (6\' 2" )   Wt 86.2 kg   BMI 24.39 kg/m   Physical Exam  CONSTITUTIONAL: Well developed/well nourished HEAD: Normocephalic/atraumatic EYES: EOMI/PERRL ENMT: Mucous membranes moist, mild lower right facial swelling, no external facial abscess. Gingival abscess noted to gingiva adjacent to right lower canine.  Fluctuance noted.  Otherwise no trismus, floor of mouth soft No drooling NECK: supple no meningeal signs SPINE/BACK:entire spine nontender CV: S1/S2 noted, no murmurs/rubs/gallops noted LUNGS: Lungs are clear to auscultation bilaterally, no apparent distress ABDOMEN: soft GU:no cva tenderness NEURO: Pt is awake/alert/appropriate, moves all extremitiesx4.  No facial droop.   EXTREMITIES:   full ROM SKIN: warm, color normal PSYCH: no abnormalities of mood noted, alert and oriented to situation  ED Treatments / Results  Labs (all labs ordered are listed, but only abnormal results are displayed) Labs Reviewed - No data to display  EKG None  Radiology No results found.  Procedures .Marland Kitchen.Incision and Drainage Date/Time: 12/17/2018 1:51 AM Performed by: Zadie RhineWickline, Thanos Cousineau, MD Authorized by: Zadie RhineWickline, Morris Markham, MD  Consent:    Consent obtained:  Verbal   Consent given by:  Patient   Risks discussed:  Bleeding Location:    Indications for incision and drainage: dental abscess.   Location:  Mouth   Mouth location: dental - canine. Procedure type:    Complexity:  Simple Procedure details:    Incision types:  Stab incision   Scalpel blade:  11   Drainage:  Bloody   Drainage amount:  Moderate Post-procedure details:    Patient tolerance of procedure:  Tolerated well, no  immediate complications Dental Block Date/Time: 12/17/2018 1:51 AM Performed by: Zadie Rhine, MD Authorized by: Zadie Rhine, MD   Consent:    Consent obtained:  Verbal   Consent given by:  Patient Indications:    Indications: dental abscess and dental pain   Location:    Block type:  Inferior alveolar   Laterality:  Right Procedure details (see MAR for exact dosages):    Needle gauge:  25 G   Anesthetic injected:  Bupivacaine 0.5% w/o epi   Injection procedure:  Anatomic landmarks identified Post-procedure details:    Outcome:  Pain improved   Patient tolerance of procedure:  Tolerated well, no immediate complications     Medications Ordered in ED Medications  penicillin v potassium (VEETID) tablet 500 mg (500 mg Oral Given 12/17/18 0104)  bupivacaine (MARCAINE) 0.5 % injection 10 mL (10 mLs Infiltration Given 12/17/18 0104)     Initial Impression / Assessment and Plan / ED Course  I have reviewed the triage vital signs and the nursing notes.       Presented with dental pain and swelling.  He appeared to have a gingival abscess, but  drainage revealed only blood with minimal pus.  He has been given antibiotics he will follow-up with dentistry  Final Clinical Impressions(s) / ED Diagnoses   Final diagnoses:  Dental abscess    ED Discharge Orders         Ordered    penicillin v potassium (VEETID) 500 MG tablet  4 times daily     12/17/18 0040           Zadie Rhine, MD 12/17/18 0153

## 2018-12-17 NOTE — ED Triage Notes (Addendum)
Pain in left lower mouth.  Issue with the same last November.  Better after antibiotics but couldn't afford to get them fixed.  Large amount of swelling noted.

## 2019-04-07 ENCOUNTER — Other Ambulatory Visit: Payer: Self-pay

## 2019-04-07 ENCOUNTER — Encounter (HOSPITAL_COMMUNITY): Payer: Self-pay | Admitting: Emergency Medicine

## 2019-04-07 ENCOUNTER — Emergency Department (HOSPITAL_COMMUNITY)
Admission: EM | Admit: 2019-04-07 | Discharge: 2019-04-07 | Disposition: A | Discharge status: Home / Self Care | Payer: Self-pay | Attending: Emergency Medicine | Admitting: Emergency Medicine

## 2019-04-07 DIAGNOSIS — R51 Headache: Secondary | ICD-10-CM | POA: Insufficient documentation

## 2019-04-07 DIAGNOSIS — Z79899 Other long term (current) drug therapy: Secondary | ICD-10-CM | POA: Insufficient documentation

## 2019-04-07 DIAGNOSIS — F17210 Nicotine dependence, cigarettes, uncomplicated: Secondary | ICD-10-CM | POA: Insufficient documentation

## 2019-04-07 DIAGNOSIS — R519 Headache, unspecified: Secondary | ICD-10-CM

## 2019-04-07 MED ORDER — SODIUM CHLORIDE 0.9 % IV BOLUS
1000.0000 mL | Freq: Once | INTRAVENOUS | Status: AC
  Administered 2019-04-07: 1000 mL via INTRAVENOUS

## 2019-04-07 MED ORDER — DEXAMETHASONE SODIUM PHOSPHATE 10 MG/ML IJ SOLN
10.0000 mg | Freq: Once | INTRAMUSCULAR | Status: AC
  Administered 2019-04-07: 10 mg via INTRAVENOUS
  Filled 2019-04-07: qty 1

## 2019-04-07 MED ORDER — DIPHENHYDRAMINE HCL 50 MG/ML IJ SOLN
50.0000 mg | Freq: Once | INTRAMUSCULAR | Status: AC
  Administered 2019-04-07: 50 mg via INTRAVENOUS
  Filled 2019-04-07: qty 1

## 2019-04-07 MED ORDER — PROCHLORPERAZINE EDISYLATE 10 MG/2ML IJ SOLN
10.0000 mg | Freq: Once | INTRAMUSCULAR | Status: AC
  Administered 2019-04-07: 10 mg via INTRAVENOUS
  Filled 2019-04-07: qty 2

## 2019-04-07 NOTE — ED Notes (Signed)
Pt on high-flow O2

## 2019-04-07 NOTE — ED Triage Notes (Signed)
Patient with history of migraines, states this one started on Friday, getting worse thru the weekend.  He has taken multiple thing OTC.  Patient states that he has been nauseated and vomited x2.  He has been able to eat and drink.  He is photophobic and noise sensitive.

## 2019-04-07 NOTE — ED Provider Notes (Signed)
[facility] EMERGENCY DEPARTMENT Provider Note   CSN: [id number] Arrival date & time: 04/07/19  5329     History   Chief Complaint Chief Complaint  Patient presents with  . Migraine    HPI Walter Benitez is a 28 y.o. male with past medical history of migraines presents emergency department today with chief complaint of headache x2 days.  Patient states headache feels similar to those he has had in the past.  He states it is a throbbing sensation located in the back of his head. Pain does not radiate.  He rates the pain 9 out of 10 in severity.  He has tried taking Excedrin and Goody powders at home without symptom relief.  He reports associated nausea with 2 episodes of nonbloody nonbilious emesis, photophobia, phonophobia.  He admits headache has progressively worsened since onset. Denies fever, syncope, head trauma, UL throbbing, N/V, visual changes, stiff neck, neck pain, rash, or "thunderclap" onset. History provided by patient with additional history obtained from chart review.      Past Medical History:  Diagnosis Date  . Migraine   . Unspecified disease of the salivary glands     There are no active problems to display for this patient.   Past Surgical History:  Procedure Laterality Date  . THROAT SURGERY          Home Medications    Prior to Admission medications   Medication Sig Start Date End Date Taking? Authorizing Provider  aspirin-acetaminophen-caffeine (EXCEDRIN MIGRAINE) [phone] MG tablet Take 2 tablets by mouth every 6 (six) hours as needed for headache.    [provider]  fluticasone (FLONASE) 50 MCG/ACT nasal spray Place 2 sprays into both nostrils daily. 08/04/17   Julianne Rice, MD  loratadine (CLARITIN) 10 MG tablet Take 1 tablet (10 mg total) by mouth daily. 08/04/17   Julianne Rice, MD  penicillin v potassium (VEETID) 500 MG tablet Take 1 tablet (500 mg total) by mouth 4 (four) times daily. 12/17/18    Ripley Fraise, MD    Family History No family history on file.  Social History Social History   Tobacco Use  . Smoking status: Current Every Day Smoker    Packs/day: 0.25    Types: Cigarettes  . Smokeless tobacco: Former Network engineer Use Topics  . Alcohol use: Yes    Comment: occasionally  . Drug use: No     Allergies   Patient has no known allergies.   Review of Systems Review of Systems  Constitutional: Negative for chills and fever.  HENT: Negative for congestion, rhinorrhea, sinus pressure and sore throat.   Eyes: Positive for photophobia. Negative for pain and redness.  Respiratory: Negative for cough, shortness of breath and wheezing.   Cardiovascular: Negative for chest pain and palpitations.  Gastrointestinal: Positive for nausea and vomiting. Negative for abdominal pain, constipation and diarrhea.  Genitourinary: Negative for dysuria.  Musculoskeletal: Negative for arthralgias, back pain, myalgias and neck pain.  Skin: Negative for rash and wound.  Neurological: Positive for headaches. Negative for dizziness, syncope, weakness and numbness.  Psychiatric/Behavioral: Negative for confusion.     Physical Exam Updated Vital Signs BP (!) 153/111 (BP Location: Right Arm)   Pulse 62   Temp 98 F (36.7 C) (Oral)   Resp 17   SpO2 100%   Physical Exam Vitals signs and nursing note reviewed.  Constitutional:      General: He is not in acute distress.    Appearance: He is not  ill-appearing.  HENT:     Head: Normocephalic and atraumatic.     Right Ear: Tympanic membrane and external ear normal.     Left Ear: Tympanic membrane and external ear normal.     Nose: Nose normal.     Mouth/Throat:     Mouth: Mucous membranes are moist.     Pharynx: Oropharynx is clear.  Eyes:     General: No scleral icterus.       Right eye: No discharge.        Left eye: No discharge.     Extraocular Movements: Extraocular movements intact.     Conjunctiva/sclera:  Conjunctivae normal.     Pupils: Pupils are equal, round, and reactive to light.  Neck:     Musculoskeletal: Normal range of motion.     Vascular: No JVD.  Cardiovascular:     Rate and Rhythm: Normal rate and regular rhythm.     Pulses: Normal pulses.          Radial pulses are 2+ on the right side and 2+ on the left side.     Heart sounds: Normal heart sounds.  Pulmonary:     Comments: Lungs clear to auscultation in all fields. Symmetric chest rise. No wheezing, rales, or rhonchi. Abdominal:     Comments: Abdomen is soft, non-distended, and non-tender in all quadrants. No rigidity, no guarding. No peritoneal signs.  Musculoskeletal: Normal range of motion.  Skin:    General: Skin is warm and dry.     Capillary Refill: Capillary refill takes less than 2 seconds.  Neurological:     Mental Status: He is oriented to person, place, and time.     GCS: GCS eye subscore is 4. GCS verbal subscore is 5. GCS motor subscore is 6.     Comments: Fluent speech, no facial droop. Speech is clear and goal oriented, follows commands CN III-XII intact, no facial droop Normal strength in upper and lower extremities bilaterally including dorsiflexion and plantar flexion, strong and equal grip strength Sensation normal to light and sharp touch Moves extremities without ataxia, coordination intact Normal finger to nose and rapid alternating movements Normal gait and balance  Psychiatric:        Behavior: Behavior normal.      ED Treatments / Results  Labs (all labs ordered are listed, but only abnormal results are displayed) Labs Reviewed - No data to display  EKG None  Radiology No results found.  Procedures Procedures (including critical care time)  Medications Ordered in ED Medications  sodium chloride 0.9 % bolus 1,000 mL (0 mLs Intravenous Stopped 04/07/19 0933)  diphenhydrAMINE (BENADRYL) injection 50 mg (50 mg Intravenous Given 04/07/19 0824)  prochlorperazine (COMPAZINE) injection  10 mg (10 mg Intravenous Given 04/07/19 0824)  dexamethasone (DECADRON) injection 10 mg (10 mg Intravenous Given 04/07/19 0824)     Initial Impression / Assessment and Plan / ED Course  I have reviewed the triage vital signs and the nursing notes.  Pertinent labs & imaging results that were available during my care of the patient were reviewed by me and considered in my medical decision making (see chart for details).  Pt HA treated with IV benadryl, decadron, compazine and improved while in ED.  Presentation is like pts typical HA and non concerning for [facility], ICH, Meningitis, or temporal arteritis. Pt is afebrile with no focal neuro deficits, nuchal rigidity, or change in vision. Pt is to follow up with PCP to discuss prophylactic medication. Pt verbalizes understanding  and is agreeable with plan to dc. Pt was hypertensive on arrival, suspect this was replated to pain. BP improved at discharge.  This note was prepared using Dragon voice recognition software and may include unintentional dictation errors due to the inherent limitations of voice recognition software.   Vitals:   04/07/19 0638 04/07/19 0825 04/07/19 0942  BP: (!) 153/111  131/79  Pulse: (!) 58 62   Resp: 18 17   Temp: 98 F (36.7 C)    TempSrc: Oral    SpO2: 100% 100%      Final Clinical Impressions(s) / ED Diagnoses   Final diagnoses:  Cluster headache, not intractable, unspecified chronicity pattern    ED Discharge Orders    None       Sherene Sireslbrizze, Surafel Hilleary E, PA-C 04/07/19 [id number]    Tegeler, Canary Brimhristopher J, MD 04/07/19 1630

## 2019-04-07 NOTE — Discharge Instructions (Signed)
Today you were evaluated at the emergency department for a headache. ° °1. Medications: continue usual home medications °2. Treatment: rest, drink plenty of fluids, if headache persists take 800mmg ibuprofen with caffeine °3. Follow Up: Please followup with your primary doctor in 3 days and neurology within 1 week for discussion of your diagnoses and further evaluation after today's visit; if you do not have a primary care doctor use the resource guide provided to find one; Please return to the ER for double vision, speech difficulty, gait disturbance, persistent vomiting or other concerns. ° °Headache: ° °You are having a headache. No specific cause was found today for your headache. It may have been a migraine or other cause of headache. Stress, anxiety, fatigue, and depression are common triggers for headaches. Your headache today does not appear to be life-threatening or require hospitalization, but often the exact cause of headaches is not determined in the emergency department. Therefore, followup with your doctor is very important to find out what may have caused your headache, and whether or not you need any further diagnostic testing or treatment. Sometimes headaches can appear benign but then more serious symptoms can develop which should prompt an immediate reevaluation by your doctor or the emergency department. ° °Hydration: Have a goal of about a half liter of water every couple hours to stay well hydrated.  ° °Sleep: Please be sure to get plenty of sleep with a goal of 8 hours per night. Having a regular bed time and bedtime routine can help with this. ° °Screens: Reduce the amount of time you are in front of screens.  Take about a 5-10-minute break every hour or every couple hours to give your eyes rest.  Do not use screens in dark rooms.  Glasses with a blue light filter may also help reduce eye fatigue. ° °Stress: Take steps to reduce stress as much as possible.  ° °Seek immediate medical attention  if: ° °You develop possible problems with medications prescribed. °The medications don't resolve your headache, if it recurs, or if you have multiple episodes of vomiting or can't take fluids by mouth °You have a change from the usual headache. °If you developed a sudden severe headache or confusion, become poorly responsive or faint, developed a fever above 100.4 or problems breathing, have a change in speech, vision, swallowing or understanding, or developed new weakness, numbness, tingling, incoordination or have a seizure. ° ° ° °

## 2019-07-18 ENCOUNTER — Other Ambulatory Visit: Payer: Self-pay

## 2019-07-18 DIAGNOSIS — Z20822 Contact with and (suspected) exposure to covid-19: Secondary | ICD-10-CM

## 2019-07-19 LAB — NOVEL CORONAVIRUS, NAA: SARS-CoV-2, NAA: NOT DETECTED

## 2020-10-27 ENCOUNTER — Emergency Department (HOSPITAL_COMMUNITY)
Admission: EM | Admit: 2020-10-27 | Discharge: 2020-10-27 | Disposition: A | Discharge status: Home / Self Care | Payer: Self-pay | Attending: Emergency Medicine | Admitting: Emergency Medicine

## 2020-10-27 DIAGNOSIS — R112 Nausea with vomiting, unspecified: Secondary | ICD-10-CM | POA: Insufficient documentation

## 2020-10-27 DIAGNOSIS — R109 Unspecified abdominal pain: Secondary | ICD-10-CM | POA: Insufficient documentation

## 2020-10-27 DIAGNOSIS — R232 Flushing: Secondary | ICD-10-CM | POA: Insufficient documentation

## 2020-10-27 DIAGNOSIS — R509 Fever, unspecified: Secondary | ICD-10-CM | POA: Insufficient documentation

## 2020-10-27 DIAGNOSIS — Z5321 Procedure and treatment not carried out due to patient leaving prior to being seen by health care provider: Secondary | ICD-10-CM | POA: Insufficient documentation

## 2020-10-27 LAB — COMPREHENSIVE METABOLIC PANEL
ALT: 41 U/L (ref 0–44)
AST: 60 U/L — ABNORMAL HIGH (ref 15–41)
Albumin: 4.2 g/dL (ref 3.5–5.0)
Alkaline Phosphatase: 59 U/L (ref 38–126)
Anion gap: 10 (ref 5–15)
BUN: 15 mg/dL (ref 6–20)
CO2: 27 mmol/L (ref 22–32)
Calcium: 9.3 mg/dL (ref 8.9–10.3)
Chloride: 99 mmol/L (ref 98–111)
Creatinine, Ser: 0.88 mg/dL (ref 0.61–1.24)
GFR, Estimated: >60 mL/min (ref >60)
Glucose, Bld: 98 mg/dL (ref 70–99)
Potassium: 4 mmol/L (ref 3.5–5.1)
Sodium: 136 mmol/L (ref 135–145)
Total Bilirubin: 1.4 mg/dL — ABNORMAL HIGH (ref 0.3–1.2)
Total Protein: 7.2 g/dL (ref 6.5–8.1)

## 2020-10-27 LAB — URINALYSIS, ROUTINE W REFLEX MICROSCOPIC
Bilirubin Urine: NEGATIVE
Glucose, UA: NEGATIVE mg/dL
Hgb urine dipstick: NEGATIVE
Ketones, ur: NEGATIVE mg/dL
Leukocytes,Ua: NEGATIVE
Nitrite: NEGATIVE
Protein, ur: NEGATIVE mg/dL
Specific Gravity, Urine: 1.014 (ref 1.005–1.030)
pH: 6 (ref 5.0–8.0)

## 2020-10-27 LAB — CBC
HCT: 48.6 % (ref 39.0–52.0)
Hemoglobin: 16.2 g/dL (ref 13.0–17.0)
MCH: 28.4 pg (ref 26.0–34.0)
MCHC: 33.3 g/dL (ref 30.0–36.0)
MCV: 85.1 fL (ref 80.0–100.0)
Platelets: 215 10*3/uL (ref 150–400)
RBC: 5.71 MIL/uL (ref 4.22–5.81)
RDW: 13.2 % (ref 11.5–15.5)
WBC: 3.3 10*3/uL — ABNORMAL LOW (ref 4.0–10.5)
nRBC: 0 % (ref 0.0–0.2)

## 2020-10-27 LAB — LIPASE, BLOOD: Lipase: 30 U/L (ref 11–51)

## 2020-10-27 MED ORDER — ONDANSETRON 4 MG PO TBDP
4.0000 mg | ORAL_TABLET | Freq: Once | ORAL | Status: AC | PRN
  Administered 2020-10-27: 4 mg via ORAL
  Filled 2020-10-27: qty 1

## 2020-10-27 NOTE — ED Triage Notes (Signed)
Pt reports Nausea & 3 episode of vomiting/ hot flashes / low grade fever and right flank pain starting on Saturday

## 2020-10-27 NOTE — ED Notes (Signed)
Pt called for registation , and vitals no answer. Pt called multiple times

## 2023-03-14 ENCOUNTER — Emergency Department (HOSPITAL_COMMUNITY): Admission: EM | Admit: 2023-03-14 | Discharge: 2023-03-14 | Discharge status: Against Medical Advice | Payer: Self-pay

## 2023-03-14 NOTE — ED Triage Notes (Signed)
No answer when called. Not in triage room.

## 2023-03-14 NOTE — ED Notes (Signed)
Pt not in triage room. Called cell phone with no answer

## 2024-05-10 ENCOUNTER — Other Ambulatory Visit: Payer: Self-pay

## 2024-05-10 ENCOUNTER — Encounter (HOSPITAL_COMMUNITY): Payer: Self-pay | Admitting: Emergency Medicine

## 2024-05-10 ENCOUNTER — Emergency Department (HOSPITAL_COMMUNITY)
Admission: EM | Admit: 2024-05-10 | Discharge: 2024-05-11 | Disposition: A | Discharge status: Home / Self Care | Payer: Self-pay | Attending: Emergency Medicine | Admitting: Emergency Medicine

## 2024-05-10 ENCOUNTER — Emergency Department (HOSPITAL_COMMUNITY): Payer: Self-pay

## 2024-05-10 DIAGNOSIS — W5501XA Bitten by cat, initial encounter: Secondary | ICD-10-CM | POA: Insufficient documentation

## 2024-05-10 DIAGNOSIS — Z23 Encounter for immunization: Secondary | ICD-10-CM | POA: Insufficient documentation

## 2024-05-10 DIAGNOSIS — S60410A Abrasion of right index finger, initial encounter: Secondary | ICD-10-CM | POA: Insufficient documentation

## 2024-05-10 LAB — COMPREHENSIVE METABOLIC PANEL WITH GFR
ALT: 29 U/L (ref 0–44)
AST: 22 U/L (ref 15–41)
Albumin: 4.6 g/dL (ref 3.5–5.0)
Alkaline Phosphatase: 51 U/L (ref 38–126)
Anion gap: 9 (ref 5–15)
BUN: 11 mg/dL (ref 6–20)
CO2: 24 mmol/L (ref 22–32)
Calcium: 9.5 mg/dL (ref 8.9–10.3)
Chloride: 104 mmol/L (ref 98–111)
Creatinine, Ser: 0.75 mg/dL (ref 0.61–1.24)
GFR, Estimated: >60 mL/min (ref >60)
Glucose, Bld: 92 mg/dL (ref 70–99)
Potassium: 3.9 mmol/L (ref 3.5–5.1)
Sodium: 137 mmol/L (ref 135–145)
Total Bilirubin: 0.5 mg/dL (ref 0.0–1.2)
Total Protein: 7.4 g/dL (ref 6.5–8.1)

## 2024-05-10 LAB — CBC WITH DIFFERENTIAL/PLATELET
Abs Immature Granulocytes: 0.01 K/uL (ref 0.00–0.07)
Basophils Absolute: 0 K/uL (ref 0.0–0.1)
Basophils Relative: 0 %
Eosinophils Absolute: 0.1 K/uL (ref 0.0–0.5)
Eosinophils Relative: 1 %
HCT: 47.2 % (ref 39.0–52.0)
Hemoglobin: 15.5 g/dL (ref 13.0–17.0)
Immature Granulocytes: 0 %
Lymphocytes Relative: 18 %
Lymphs Abs: 1.2 K/uL (ref 0.7–4.0)
MCH: 28.1 pg (ref 26.0–34.0)
MCHC: 32.8 g/dL (ref 30.0–36.0)
MCV: 85.5 fL (ref 80.0–100.0)
Monocytes Absolute: 0.7 K/uL (ref 0.1–1.0)
Monocytes Relative: 10 %
Neutro Abs: 4.8 K/uL (ref 1.7–7.7)
Neutrophils Relative %: 71 %
Platelets: 271 K/uL (ref 150–400)
RBC: 5.52 MIL/uL (ref 4.22–5.81)
RDW: 12.2 % (ref 11.5–15.5)
WBC: 6.8 K/uL (ref 4.0–10.5)
nRBC: 0 % (ref 0.0–0.2)

## 2024-05-10 LAB — I-STAT CG4 LACTIC ACID, ED
Lactic Acid, Venous: 0.3 mmol/L — ABNORMAL LOW (ref 0.5–1.9)
Lactic Acid, Venous: 0.7 mmol/L (ref 0.5–1.9)

## 2024-05-10 MED ORDER — IBUPROFEN 800 MG PO TABS
800.0000 mg | ORAL_TABLET | Freq: Once | ORAL | Status: AC
  Administered 2024-05-11: 800 mg via ORAL
  Filled 2024-05-10: qty 1

## 2024-05-10 MED ORDER — TETANUS-DIPHTH-ACELL PERTUSSIS 5-2-15.5 LF-MCG/0.5 IM SUSP
0.5000 mL | Freq: Once | INTRAMUSCULAR | Status: AC
  Administered 2024-05-11: 0.5 mL via INTRAMUSCULAR
  Filled 2024-05-10: qty 0.5

## 2024-05-10 MED ORDER — AMOXICILLIN-POT CLAVULANATE 875-125 MG PO TABS
1.0000 | ORAL_TABLET | Freq: Once | ORAL | Status: AC
  Administered 2024-05-11: 1 via ORAL
  Filled 2024-05-10: qty 1

## 2024-05-10 NOTE — ED Provider Notes (Signed)
 [place] EMERGENCY DEPARTMENT AT [facility] Provider Note   CSN: [id number] Arrival date & time: 05/10/24  1850     Patient presents with: Finger Infection   Walter Benitez is a 33 y.o. male.   Patient believes he was scratched by his cat around 430 this morning while sleeping.  Did not see what happened but believes it was a scratch not a bite.  Has pain, swelling, redness to his right index finger with pain with range of motion.  No fever.  No chest pain or shortness of breath.  Pain has increased throughout the day and swelling has increased.  He is requesting antibiotics.  Unknown last tetanus shot.  He believes the cats vaccines are up-to-date but is not quite certain.  Knows they have had at least 1 series of rabies vaccines.  The history is provided by the patient.       Prior to Admission medications   Medication Sig Start Date End Date Taking? Authorizing Provider  aspirin-acetaminophen -caffeine  (EXCEDRIN MIGRAINE) 250-250-65 MG tablet Take 2 tablets by mouth every 6 (six) hours as needed for headache.    [provider]  fluticasone  (FLONASE ) 50 MCG/ACT nasal spray Place 2 sprays into both nostrils daily. 08/04/17   Carlyle Lenis, MD  loratadine  (CLARITIN ) 10 MG tablet Take 1 tablet (10 mg total) by mouth daily. 08/04/17   Carlyle Lenis, MD  penicillin  v potassium (VEETID) 500 MG tablet Take 1 tablet (500 mg total) by mouth 4 (four) times daily. 12/17/18   Midge Golas, MD    Allergies: Patient has no known allergies.    Review of Systems  Constitutional:  Negative for activity change, appetite change and fever.  HENT:  Negative for congestion and rhinorrhea.   Respiratory:  Negative for cough, chest tightness and shortness of breath.   Cardiovascular:  Negative for chest pain.  Gastrointestinal:  Negative for abdominal pain, anal bleeding, nausea and vomiting.  Genitourinary:  Negative for dysuria.  Musculoskeletal:  Negative for  back pain.  Skin:  Positive for wound. Negative for rash.  Neurological:  Negative for dizziness, weakness and headaches.   all other systems are negative except as noted in the HPI and PMH.    Updated Vital Signs BP 120/72   Pulse 83   Temp 98.4 F (36.9 C)   Resp 16   Wt 90.7 kg   SpO2 100%   BMI 26.38 kg/m   Physical Exam Vitals and nursing note reviewed.  Constitutional:      General: He is not in acute distress.    Appearance: He is well-developed.  HENT:     Head: Normocephalic and atraumatic.     Mouth/Throat:     Pharynx: No oropharyngeal exudate.  Eyes:     Conjunctiva/sclera: Conjunctivae normal.     Pupils: Pupils are equal, round, and reactive to light.  Neck:     Comments: No meningismus. Cardiovascular:     Rate and Rhythm: Normal rate and regular rhythm.     Heart sounds: Normal heart sounds. No murmur heard. Pulmonary:     Effort: Pulmonary effort is normal. No respiratory distress.     Breath sounds: Normal breath sounds.  Abdominal:     Palpations: Abdomen is soft.     Tenderness: There is no abdominal tenderness. There is no guarding or rebound.  Musculoskeletal:        General: Swelling, tenderness and signs of injury present.     Cervical back: Normal range of  motion and neck supple.     Comments: Abrasion to R index finger as depicted with suspected puncture wound to distal phalanx. Reduced ROM 2/2 pain. Pain with ROM at PIP and DIP joint. Diffusely tender along entire finger on palmar surface. Pain with ROM of PIP and PIP joints.  Skin:    General: Skin is warm.  Neurological:     Mental Status: He is alert and oriented to person, place, and time.     Cranial Nerves: No cranial nerve deficit.     Motor: No abnormal muscle tone.     Coordination: Coordination normal.     Comments:  5/5 strength throughout. CN 2-12 intact.Equal grip strength.   Psychiatric:        Behavior: Behavior normal.        (all labs ordered are listed, but only  abnormal results are displayed) Labs Reviewed  I-STAT CG4 LACTIC ACID, ED - Abnormal; Notable for the following components:      Result Value   Lactic Acid, Venous 0.3 (*)    All other components within normal limits  COMPREHENSIVE METABOLIC PANEL WITH GFR  CBC WITH DIFFERENTIAL/PLATELET  I-STAT CG4 LACTIC ACID, ED    EKG: None  Radiology: DG Finger Index Right Result Date: 05/10/2024 CLINICAL DATA:  Status post cat bite. EXAM: RIGHT INDEX FINGER 2+V COMPARISON:  None Available. FINDINGS: There is no evidence of fracture or dislocation. There is no evidence of arthropathy or other focal bone abnormality. Mild diffuse soft tissue swelling is noted. IMPRESSION: Mild diffuse soft tissue swelling without evidence of an acute osseous abnormality. Electronically Signed   By: Suzen Dials M.D.   On: 05/10/2024 23:46     Procedures   Medications Ordered in the ED  Tdap (ADACEL) injection 0.5 mL (has no administration in time range)  amoxicillin -clavulanate (AUGMENTIN ) 875-125 MG per tablet 1 tablet (has no administration in time range)  ibuprofen  (ADVIL ) tablet 800 mg (has no administration in time range)                                    Medical Decision Making Amount and/or Complexity of Data Reviewed Labs: ordered. Radiology: ordered.  Risk Prescription drug management.   Cat bit versus scratch to right index finger onset this morning while 24 hours ago.  Stable vitals.  No distress.  Will update tetanus.  Will give antibiotics.  Obtain x-ray. He is not interested in rabies prophylaxis as these are his own cats.  X-rays negative for fracture or foreign body.  Does have soft tissue swelling.  Results reviewed interpreted by me.  Tetanus updated.   Does have some tenderness along flexor side of finger with diffuse swelling and reduced range of motion.  Will discuss with hand surgery.SABRA  Antibiotics given.  Labs without significant leukocytosis.  Lactate is normal.  X-rays  negative for fracture or foreign body.  Discussed with Heather Quale PA-C of hand surgery.  She agrees with antibiotics.  Discussed possibility of early flexor tenosynovitis with possible admission for IV antibiotics.  Discussed with patient.  He does not want to be admitted at this time and wants to try outpatient antibiotics.  He is not interested in rabies prophylaxis.  He understands that rabies can be fatal and appears to have capacity to make decision  Advise warm soaks, antibiotics, recheck in 2 days with PCP or ED.  Follow-up with hand surgery.  Declines  admission for IV antibiotics today.  Discussed possibility of early flexor tenosynovitis Again patient declines admission for IV antibiotics.  He agrees to follow-up in 2 days for recheck.  Return to ED with new or worsening symptoms.  He understands this may progress and may require IV antibiotics and possible hand surgery intervention.     Final diagnoses:  None    ED Discharge Orders     None          Seline Enzor, Garnette, MD 05/11/24 0120

## 2024-05-10 NOTE — ED Triage Notes (Signed)
 Patient reports he was scratched by his cat around 0430 this morning and now his finger is swollen and oozing and it's radiating to other fingers/hand.

## 2024-05-11 MED ORDER — AMOXICILLIN-POT CLAVULANATE 875-125 MG PO TABS: 1.0000 | ORAL_TABLET | Freq: Two times a day (BID) | ORAL | 0 refills | Status: AC

## 2024-05-11 NOTE — Discharge Instructions (Signed)
 Take the antibiotics and perform warm soaks as we discussed.  He declined rabies vaccination today.  You should follow-up for recheck of this wound in 2 days at urgent care or the ED.  As we discussed there is some concern that this infection may spread to the tendon of your finger and may require hand surgery intervention.  You declined admission for IV antibiotics today.  Return to the ED with worsening pain, swelling, redness, unable to move finger or other concerns

## 2024-05-11 NOTE — ED Notes (Signed)
 Wound care provided to pts finger, and clean dressing applied.
